# Patient Record
Sex: Male | Born: 1975 | Race: White | Hispanic: No | Marital: Married | Smoking: Former smoker
Health system: Southern US, Community
[De-identification: ages and names within clinical notes are randomized; demographics above are authoritative.]

## PROBLEM LIST (undated history)

## (undated) DIAGNOSIS — J111 Influenza due to unidentified influenza virus with other respiratory manifestations: Secondary | ICD-10-CM

## (undated) DIAGNOSIS — R519 Headache, unspecified: Secondary | ICD-10-CM

## (undated) DIAGNOSIS — R51 Headache: Secondary | ICD-10-CM

## (undated) HISTORY — PX: TYMPANOSTOMY TUBE PLACEMENT: SHX32

## (undated) HISTORY — PX: HERNIA REPAIR: SHX51

---

## 2003-01-10 HISTORY — PX: MANDIBLE FRACTURE SURGERY: SHX706

## 2005-12-03 ENCOUNTER — Emergency Department: Payer: Self-pay | Admitting: Emergency Medicine

## 2005-12-04 ENCOUNTER — Emergency Department: Payer: Self-pay

## 2005-12-05 ENCOUNTER — Emergency Department: Payer: Self-pay | Admitting: Emergency Medicine

## 2005-12-06 ENCOUNTER — Emergency Department: Payer: Self-pay | Admitting: Emergency Medicine

## 2007-04-07 ENCOUNTER — Emergency Department: Payer: Self-pay | Admitting: Emergency Medicine

## 2009-07-05 ENCOUNTER — Emergency Department: Payer: Self-pay | Admitting: Emergency Medicine

## 2010-09-16 ENCOUNTER — Emergency Department: Payer: Self-pay | Admitting: Emergency Medicine

## 2012-01-14 ENCOUNTER — Emergency Department: Payer: Self-pay | Admitting: Emergency Medicine

## 2012-01-17 ENCOUNTER — Emergency Department: Payer: Self-pay | Admitting: Emergency Medicine

## 2012-09-15 ENCOUNTER — Emergency Department: Payer: Self-pay | Admitting: Emergency Medicine

## 2012-09-15 LAB — COMPREHENSIVE METABOLIC PANEL
Alkaline Phosphatase: 160 U/L — ABNORMAL HIGH (ref 50–136)
Anion Gap: 6 — ABNORMAL LOW (ref 7–16)
Bilirubin,Total: 0.4 mg/dL (ref 0.2–1.0)
Calcium, Total: 8.8 mg/dL (ref 8.5–10.1)
Chloride: 106 mmol/L (ref 98–107)
Co2: 27 mmol/L (ref 21–32)
Creatinine: 0.88 mg/dL (ref 0.60–1.30)
EGFR (Non-African Amer.): >60
Glucose: 140 mg/dL — ABNORMAL HIGH (ref 65–99)
SGOT(AST): 30 U/L (ref 15–37)
Sodium: 139 mmol/L (ref 136–145)
Total Protein: 7.2 g/dL (ref 6.4–8.2)

## 2012-09-15 LAB — CBC
HCT: 44.9 % (ref 40.0–52.0)
HGB: 15.4 g/dL (ref 13.0–18.0)
MCH: 30.5 pg (ref 26.0–34.0)
MCV: 89 fL (ref 80–100)
RBC: 5.05 10*6/uL (ref 4.40–5.90)

## 2014-02-26 ENCOUNTER — Ambulatory Visit (INDEPENDENT_AMBULATORY_CARE_PROVIDER_SITE_OTHER): Payer: BLUE CROSS/BLUE SHIELD | Admitting: Nurse Practitioner

## 2014-02-26 ENCOUNTER — Encounter: Payer: Self-pay | Admitting: Nurse Practitioner

## 2014-02-26 ENCOUNTER — Other Ambulatory Visit: Payer: Self-pay | Admitting: Nurse Practitioner

## 2014-02-26 VITALS — BP 138/78 | HR 96 | Temp 98.1°F | Resp 14 | Ht 75.0 in | Wt 271.0 lb

## 2014-02-26 DIAGNOSIS — Z720 Tobacco use: Secondary | ICD-10-CM

## 2014-02-26 DIAGNOSIS — Z0000 Encounter for general adult medical examination without abnormal findings: Secondary | ICD-10-CM

## 2014-02-26 DIAGNOSIS — F17200 Nicotine dependence, unspecified, uncomplicated: Secondary | ICD-10-CM

## 2014-02-26 DIAGNOSIS — Z7689 Persons encountering health services in other specified circumstances: Secondary | ICD-10-CM

## 2014-02-26 DIAGNOSIS — Z7189 Other specified counseling: Secondary | ICD-10-CM

## 2014-02-26 DIAGNOSIS — M545 Low back pain, unspecified: Secondary | ICD-10-CM

## 2014-02-26 DIAGNOSIS — L02211 Cutaneous abscess of abdominal wall: Secondary | ICD-10-CM

## 2014-02-26 LAB — LIPID PANEL
CHOL/HDL RATIO: 4
Cholesterol: 139 mg/dL (ref 0–200)
HDL: 32.4 mg/dL — AB (ref >39.00)
LDL Cholesterol: 77 mg/dL (ref 0–99)
NonHDL: 106.6
TRIGLYCERIDES: 147 mg/dL (ref 0.0–149.0)
VLDL: 29.4 mg/dL (ref 0.0–40.0)

## 2014-02-26 LAB — COMPREHENSIVE METABOLIC PANEL
ALT: 34 U/L (ref 0–53)
AST: 21 U/L (ref 0–37)
Albumin: 4.7 g/dL (ref 3.5–5.2)
Alkaline Phosphatase: 126 U/L — ABNORMAL HIGH (ref 39–117)
BILIRUBIN TOTAL: 0.6 mg/dL (ref 0.2–1.2)
BUN: 23 mg/dL (ref 6–23)
CO2: 29 mEq/L (ref 19–32)
Calcium: 10.3 mg/dL (ref 8.4–10.5)
Chloride: 104 mEq/L (ref 96–112)
Creatinine, Ser: 0.94 mg/dL (ref 0.40–1.50)
GFR: 95.91 mL/min (ref >60.00)
Glucose, Bld: 90 mg/dL (ref 70–99)
POTASSIUM: 4.7 meq/L (ref 3.5–5.1)
Sodium: 137 mEq/L (ref 135–145)
Total Protein: 8 g/dL (ref 6.0–8.3)

## 2014-02-26 LAB — CBC WITH DIFFERENTIAL/PLATELET
BASOS PCT: 0.4 % (ref 0.0–3.0)
Basophils Absolute: 0 10*3/uL (ref 0.0–0.1)
EOS ABS: 0.2 10*3/uL (ref 0.0–0.7)
Eosinophils Relative: 1.8 % (ref 0.0–5.0)
HCT: 48.2 % (ref 39.0–52.0)
HEMOGLOBIN: 16.5 g/dL (ref 13.0–17.0)
LYMPHS PCT: 28.8 % (ref 12.0–46.0)
Lymphs Abs: 3.1 10*3/uL (ref 0.7–4.0)
MCHC: 34.3 g/dL (ref 30.0–36.0)
MCV: 85.6 fl (ref 78.0–100.0)
Monocytes Absolute: 0.6 10*3/uL (ref 0.1–1.0)
Monocytes Relative: 5.8 % (ref 3.0–12.0)
NEUTROS ABS: 6.8 10*3/uL (ref 1.4–7.7)
Neutrophils Relative %: 63.2 % (ref 43.0–77.0)
Platelets: 222 10*3/uL (ref 150.0–400.0)
RBC: 5.64 Mil/uL (ref 4.22–5.81)
RDW: 13.9 % (ref 11.5–15.5)
WBC: 10.8 10*3/uL — ABNORMAL HIGH (ref 4.0–10.5)

## 2014-02-26 LAB — HEMOGLOBIN A1C: Hgb A1c MFr Bld: 5.9 % (ref 4.6–6.5)

## 2014-02-26 LAB — TSH: TSH: 1.59 u[IU]/mL (ref 0.35–4.50)

## 2014-02-26 MED ORDER — NICOTINE 14 MG/24HR TD PT24: 14.0000 mg | MEDICATED_PATCH | Freq: Every day | TRANSDERMAL | Status: DC

## 2014-02-26 MED ORDER — ALBUTEROL SULFATE HFA 108 (90 BASE) MCG/ACT IN AERS: 2.0000 | INHALATION_SPRAY | Freq: Four times a day (QID) | RESPIRATORY_TRACT | Status: DC | PRN

## 2014-02-26 MED ORDER — MUPIROCIN 2 % EX OINT: 1.0000 "application " | TOPICAL_OINTMENT | Freq: Two times a day (BID) | CUTANEOUS | Status: DC

## 2014-02-26 MED ORDER — NICOTINE 7 MG/24HR TD PT24: 7.0000 mg | MEDICATED_PATCH | Freq: Every day | TRANSDERMAL | Status: DC

## 2014-02-26 NOTE — Progress Notes (Signed)
Subjective:    Patient ID: Albert Houston, male    DOB: 1975-05-15, 39 y.o.   MRN: [id number]  HPI  Albert Houston is a 39 yo male establishing care and with CCs of wheezing, weight loss, and tobacco abuse.   1) New Pt info-   Diet- Works all the time, eats a poor diet with lots of fast food  Exercise- Constantly walking at work  Immunizations- Needs Tdap, Flu had through work  [employer] Exam- Up to date  Dental Exam- Getting implants done, very poor dentition  2) Chronic Problems-  No chronic issues  3) Acute Problems-  Back pain- when lies down his legs go numb and hurt, had an accident a few months ago, fell down, He states this does not happen all the time. Discussed possible treatment and wants to wait.   Wants to quit smoking- 3 ppd and vaporizer   Bruising along lower abdomen Used to drink a lot of alcohol cases. Now drinking 4 cans a month     Review of Systems  Constitutional: Negative for fever, chills, diaphoresis, activity change, appetite change and fatigue.  Eyes: Negative for visual disturbance.  Respiratory: Positive for wheezing. Negative for cough, chest tightness and shortness of breath.   Cardiovascular: Negative for chest pain, palpitations and leg swelling.  Gastrointestinal: Negative for nausea, vomiting, diarrhea and blood in stool.  Genitourinary: Negative for dysuria and difficulty urinating.  Musculoskeletal: Positive for back pain and arthralgias. Negative for myalgias, joint swelling, gait problem, neck pain and neck stiffness.  Skin: Negative for rash.  Neurological: Positive for numbness. Negative for dizziness and weakness.  Hematological: Does not bruise/bleed easily.  Psychiatric/Behavioral: Positive for sleep disturbance. Negative for suicidal ideas. The patient is not nervous/anxious.    History reviewed. No pertinent past medical history.  History   Social History  . Marital Status: Single    Spouse Name: N/A  . Number of Children: N/A  .  Years of Education: N/A   Occupational History  . Not on file.   Social History Main Topics  . Smoking status: Current Every Day Smoker -- 3.00 packs/day  . Smokeless tobacco: Never Used     Comment: Wants to try patches  . Alcohol Use: 0.6 oz/week    1 Cans of beer per week  . Drug Use: No  . Sexual Activity: Not on file   Other Topics Concern  . Not on file   Social History Narrative   Lives at home with wife   424 (Son) year old and 217 (daughter) year old    Works at [employer] in [place]    Enjoys Photographerdrag racing     Past Surgical History  Procedure Laterality Date  . Mandible fracture surgery  2005  . Tympanostomy tube placement      As a Child    Family History  Problem Relation Age of Onset  . Arthritis Maternal Grandmother   . Hypertension Maternal Grandmother   . Diabetes Paternal Grandfather     No Known Allergies  No current outpatient prescriptions on file prior to visit.   No current facility-administered medications on file prior to visit.      Objective:   Physical Exam  Constitutional: He is oriented to person, place, and time. He appears well-developed and well-nourished. No distress.  BP 138/78 mmHg  Pulse 96  Temp(Src) 98.1 F (36.7 C) (Oral)  Resp 14  Ht 6\' 3"  (1.905 m)  Wt 271 lb (122.925 kg)  BMI 33.87 kg/m2  SpO2 96%     HENT:  Head: Normocephalic and atraumatic.  Right Ear: External ear normal.  Left Ear: External ear normal.  Mouth/Throat: No oropharyngeal exudate.  Eyes: Conjunctivae and EOM are normal. Pupils are equal, round, and reactive to light. Right eye exhibits no discharge. Left eye exhibits no discharge. No scleral icterus.  Eyes are reddened  Neck: Normal range of motion. Neck supple. No tracheal deviation present. No thyromegaly present.  Cardiovascular: Normal rate, regular rhythm, normal heart sounds and intact distal pulses.  Exam reveals no gallop and no friction rub.   No murmur heard. Pulmonary/Chest:  Effort normal and breath sounds normal. No respiratory distress. He has no wheezes. He has no rales. He exhibits no tenderness.  Abdominal: Soft. Bowel sounds are normal. He exhibits no distension and no mass. There is no tenderness. There is no rebound and no guarding.  Lymphadenopathy:    He has no cervical adenopathy.  Neurological: He is alert and oriented to person, place, and time.  Uvula does not rise midline, shifts to the right  Skin: Skin is warm. No rash noted. He is not diaphoretic. There is erythema.     Redness to skin all over Poor hygeine  Psychiatric: He has a normal mood and affect. His behavior is normal. Judgment and thought content normal.       Assessment & Plan:

## 2014-02-26 NOTE — Patient Instructions (Signed)
Nicotine skin patches What is this medicine? NICOTINE (NIK oh teen) helps people stop smoking. The patches replace the nicotine found in cigarettes and help to decrease withdrawal effects. They are most effective when used in combination with a stop-smoking program. This medicine may be used for other purposes; ask your health care provider or pharmacist if you have questions. COMMON BRAND NAME(S): Habitrol, Nicoderm CQ, Nicotrol What should I tell my health care provider before I take this medicine? They need to know if you have any of these conditions: -diabetes -heart disease, angina, irregular heartbeat or previous heart attack -lung disease, including asthma -overactive thyroid -pheochromocytoma -skin problems -stomach problems or ulcers -an unusual or allergic reaction to nicotine, adhesives, other medicines, foods, dyes, or preservatives -pregnant or trying to get pregnant -breast-feeding How should I use this medicine? This medicine is for use on the skin. Follow the directions that come with the patches. Find an area of skin on your upper arm, chest, or back that is clean, dry, greaseless, undamaged and hairless. Wash hands with plain soap and water. Do not use anything that contains aloe, lanolin or glycerin as these may prevent the patch from sticking. Dry thoroughly. Remove the patch from the sealed pouch. Do not try to cut or trim the patch. Using your palm, press the patch firmly in place for 10 seconds to make sure that there is good contact with your skin. After applying the patch, wash your hands. Change the patch every day, keeping to a regular schedule. When you apply a new patch, use a new area of skin. Wait at least 1 week before using the same area again. Talk to your pediatrician regarding the use of this medicine in children. Special care may be needed. Overdosage: If you think you have taken too much of this medicine contact a poison control center or emergency room at  once. NOTE: This medicine is only for you. Do not share this medicine with others. What if I miss a dose? If you forget to replace a patch, use it as soon as you can. Only use one patch at a time and do not leave on the skin for longer than directed. If a patch falls off, you can replace it, but keep to your schedule and remove the patch at the right time. What may interact with this medicine? -medicines for asthma -medicines for blood pressure -medicines for mental depression This list may not describe all possible interactions. Give your health care provider a list of all the medicines, herbs, non-prescription drugs, or dietary supplements you use. Also tell them if you smoke, drink alcohol, or use illegal drugs. Some items may interact with your medicine. What should I watch for while using this medicine? You should begin using the nicotine patch the day you stop smoking. It is okay if you do not succeed at your attempt to quit and have a cigarette. You can still continue your quit attempt and keep using the product as directed. Just throw away your cigarettes and get back to your quit plan. You can keep the patch in place during swimming, bathing, and showering. If your patch falls off during these activities, replace it. When you first apply the patch, your skin may itch or burn. This should soon go away. When you remove a patch, the skin may look red, but this should only last for a day. Call your doctor or health care professional if you get a permanent skin rash. If you are a diabetic   and you quit smoking, the effects of insulin may be increased and you may need to reduce your insulin dose. Check with your doctor or health care professional about how you should adjust your insulin dose. If you are going to have a magnetic resonance imaging (MRI) procedure, tell your MRI technician if you have this patch on your body. It must be removed before a MRI. What side effects may I notice from  receiving this medicine? Side effects that you should report to your doctor or health care professional as soon as possible: -allergic reactions like skin rash, itching or hives, swelling of the face, lips, or tongue -breathing problems -changes in hearing -changes in vision -chest pain -cold sweats -confusion -fast, irregular heartbeat -feeling faint or lightheaded, falls -headache -increased saliva -nausea, vomiting -skin redness that lasts more than 4 days -stomach pain -weakness Side effects that usually do not require medical attention (report to your doctor or health care professional if they continue or are bothersome): -diarrhea -dry mouth -hiccups -irritability -nervousness or restlessness -trouble sleeping or vivid dreams This list may not describe all possible side effects. Call your doctor for medical advice about side effects. You may report side effects to FDA at 1-800-FDA-1088. Where should I keep my medicine? Keep out of the reach of children. Store at room temperature between 20 and 25 degrees C (68 and 77 degrees F). Protect from heat and light. Store in manufacturers packaging until ready to use. Throw away unused medicine after the expiration date. When you remove a patch, fold with sticky sides together; put in an empty opened pouch and throw away. NOTE: This sheet is a summary. It may not cover all possible information. If you have questions about this medicine, talk to your doctor, pharmacist, or health care provider.  2015, Elsevier/Gold Standard. (2013-01-15 12:51:27)  

## 2014-02-26 NOTE — Progress Notes (Signed)
Pre visit review using our clinic review tool, if applicable. No additional management support is needed unless otherwise documented below in the visit note. 

## 2014-02-27 ENCOUNTER — Telehealth: Payer: Self-pay | Admitting: *Deleted

## 2014-02-27 ENCOUNTER — Other Ambulatory Visit: Payer: Self-pay | Admitting: Nurse Practitioner

## 2014-02-27 ENCOUNTER — Telehealth: Payer: Self-pay | Admitting: Nurse Practitioner

## 2014-02-27 MED ORDER — NICOTINE 21 MG/24HR TD PT24: 21.0000 mg | MEDICATED_PATCH | Freq: Every day | TRANSDERMAL | Status: DC

## 2014-02-27 NOTE — Telephone Encounter (Signed)
Left message on pts VM that correct Rx has been sent to pharmacy

## 2014-02-27 NOTE — Telephone Encounter (Signed)
emmi mailed  °

## 2014-02-27 NOTE — Telephone Encounter (Signed)
Sorry, there was no designation when I sent it in. I found the correct mg. Sent it today at 1:08.

## 2014-02-27 NOTE — Telephone Encounter (Signed)
Pt called states the Nicotine patch Rx that was sent to pharmacy is the Step 3.  Pt needs the Step 1.  Please advise

## 2014-03-01 DIAGNOSIS — M545 Low back pain, unspecified: Secondary | ICD-10-CM | POA: Insufficient documentation

## 2014-03-01 DIAGNOSIS — F17200 Nicotine dependence, unspecified, uncomplicated: Secondary | ICD-10-CM | POA: Insufficient documentation

## 2014-03-01 DIAGNOSIS — L02211 Cutaneous abscess of abdominal wall: Secondary | ICD-10-CM | POA: Insufficient documentation

## 2014-03-01 DIAGNOSIS — Z7689 Persons encountering health services in other specified circumstances: Secondary | ICD-10-CM | POA: Insufficient documentation

## 2014-03-01 NOTE — Assessment & Plan Note (Addendum)
Stable. Wants to wait for x-rays or further treatment- neurologically and msk intact today. FU in 2 months

## 2014-03-01 NOTE — Assessment & Plan Note (Signed)
Bactroban ointment rx. Improved on its own. Asked pt to keep protected from rubbing by pants line.

## 2014-03-01 NOTE — Assessment & Plan Note (Signed)
Wants to quit smoking. Nicotine patch sent to pharmacy and albuterol inhaler refilled for wheezing. Handout given about patch.

## 2014-03-01 NOTE — Assessment & Plan Note (Signed)
Discussed acute and chronic issues. Reviewed health maintenance measures, PFSHx, and immunizations. Obtain routine labs TSH, Lipid panel, CBC w/ diff, A1c, and CMET.   

## 2014-04-27 ENCOUNTER — Ambulatory Visit: Payer: BLUE CROSS/BLUE SHIELD | Admitting: Nurse Practitioner

## 2014-05-19 ENCOUNTER — Encounter: Payer: Self-pay | Admitting: Emergency Medicine

## 2014-05-19 ENCOUNTER — Emergency Department
Admission: EM | Admit: 2014-05-19 | Discharge: 2014-05-19 | Disposition: A | Discharge status: Home / Self Care | Payer: BLUE CROSS/BLUE SHIELD | Attending: Emergency Medicine | Admitting: Emergency Medicine

## 2014-05-19 ENCOUNTER — Emergency Department: Payer: BLUE CROSS/BLUE SHIELD

## 2014-05-19 DIAGNOSIS — Y9289 Other specified places as the place of occurrence of the external cause: Secondary | ICD-10-CM | POA: Diagnosis not present

## 2014-05-19 DIAGNOSIS — W01198A Fall on same level from slipping, tripping and stumbling with subsequent striking against other object, initial encounter: Secondary | ICD-10-CM | POA: Insufficient documentation

## 2014-05-19 DIAGNOSIS — Y9389 Activity, other specified: Secondary | ICD-10-CM | POA: Insufficient documentation

## 2014-05-19 DIAGNOSIS — Z720 Tobacco use: Secondary | ICD-10-CM | POA: Insufficient documentation

## 2014-05-19 DIAGNOSIS — S99922A Unspecified injury of left foot, initial encounter: Secondary | ICD-10-CM | POA: Diagnosis present

## 2014-05-19 DIAGNOSIS — S91332A Puncture wound without foreign body, left foot, initial encounter: Secondary | ICD-10-CM | POA: Diagnosis not present

## 2014-05-19 DIAGNOSIS — Y998 Other external cause status: Secondary | ICD-10-CM | POA: Diagnosis not present

## 2014-05-19 MED ORDER — OXYCODONE-ACETAMINOPHEN 5-325 MG PO TABS
ORAL_TABLET | ORAL | Status: AC
  Administered 2014-05-19: 1 via ORAL
  Filled 2014-05-19: qty 1

## 2014-05-19 MED ORDER — OXYCODONE-ACETAMINOPHEN 5-325 MG PO TABS: 1.0000 | ORAL_TABLET | ORAL | Status: DC | PRN

## 2014-05-19 MED ORDER — CEPHALEXIN 500 MG PO CAPS
ORAL_CAPSULE | ORAL | Status: AC
  Administered 2014-05-19: 500 mg via ORAL
  Filled 2014-05-19: qty 1

## 2014-05-19 MED ORDER — CEPHALEXIN 500 MG PO CAPS: 500.0000 mg | ORAL_CAPSULE | Freq: Two times a day (BID) | ORAL | Status: DC

## 2014-05-19 MED ORDER — OXYCODONE-ACETAMINOPHEN 5-325 MG PO TABS
1.0000 | ORAL_TABLET | Freq: Once | ORAL | Status: AC
  Administered 2014-05-19: 1 via ORAL

## 2014-05-19 MED ORDER — CEPHALEXIN 500 MG PO CAPS
500.0000 mg | ORAL_CAPSULE | Freq: Once | ORAL | Status: AC
  Administered 2014-05-19: 500 mg via ORAL
  Filled 2014-05-19: qty 1

## 2014-05-19 NOTE — Discharge Instructions (Signed)
Puncture Wound °A puncture wound is an injury that extends through all layers of the skin and into the tissue beneath the skin (subcutaneous tissue). Puncture wounds become infected easily because germs often enter the body and go beneath the skin during the injury. Having a deep wound with a small entrance point makes it difficult for your caregiver to adequately clean the wound. This is especially true if you have stepped on a nail and it has passed through a dirty shoe or other situations where the wound is obviously contaminated. °CAUSES  °Many puncture wounds involve glass, nails, splinters, fish hooks, or other objects that enter the skin (foreign bodies). A puncture wound may also be caused by a human bite or animal bite. °DIAGNOSIS  °A puncture wound is usually diagnosed by your history and a physical exam. You may need to have an X-ray or an ultrasound to check for any foreign bodies still in the wound. °TREATMENT  °· Your caregiver will clean the wound as thoroughly as possible. Depending on the location of the wound, a bandage (dressing) may be applied. °· Your caregiver might prescribe antibiotic medicines. °· You may need a follow-up visit to check on your wound. Follow all instructions as directed by your caregiver. °HOME CARE INSTRUCTIONS  °· Change your dressing once per day, or as directed by your caregiver. If the dressing sticks, it may be removed by soaking the area in water. °· If your caregiver has given you follow-up instructions, it is very important that you return for a follow-up appointment. Not following up as directed could result in a chronic or permanent injury, pain, and disability. °· Only take over-the-counter or prescription medicines for pain, discomfort, or fever as directed by your caregiver. °· If you are given antibiotics, take them as directed. Finish them even if you start to feel better. °You may need a tetanus shot if: °· You cannot remember when you had your last tetanus  shot. °· You have never had a tetanus shot. °If you got a tetanus shot, your arm may swell, get red, and feel warm to the touch. This is common and not a problem. If you need a tetanus shot and you choose not to have one, there is a rare chance of getting tetanus. Sickness from tetanus can be serious. °You may need a rabies shot if an animal bite caused your puncture wound. °SEEK MEDICAL CARE IF:  °· You have redness, swelling, or increasing pain in the wound. °· You have red streaks going away from the wound. °· You notice a bad smell coming from the wound or dressing. °· You have yellowish-white fluid (pus) coming from the wound. °· You are treated with an antibiotic for infection, but the infection is not getting better. °· You notice something in the wound, such as rubber from your shoe, cloth, or another object. °· You have a fever. °· You have severe pain. °· You have difficulty breathing. °· You feel dizzy or faint. °· You cannot stop vomiting. °· You lose feeling, develop numbness, or cannot move a limb below the wound. °· Your symptoms worsen. °MAKE SURE YOU: °· Understand these instructions. °· Will watch your condition. °· Will get help right away if you are not doing well or get worse. °Document Released: 10/05/2004 Document Revised: 03/20/2011 Document Reviewed: 06/14/2010 °ExitCare® Patient Information ©2015 [employer]. This information is not intended to replace advice given to you by your health care provider. Make sure you discuss any questions you   have with your health care provider. ° °

## 2014-05-19 NOTE — ED Notes (Signed)
Pt arrived to the ED accompanied by his wife complaining of left foot pain after a "[place]" tool felt on his foot and puncture it around the ankle area (1/2 inch deep). Sensation, pulses and range of motion are slightly affected. Pt is AOx4 in no apparent distress.

## 2014-05-19 NOTE — ED Provider Notes (Signed)
[facility] Emergency Department Provider Note  ____________________________________________  Time seen: 4:35 AM  I have reviewed the triage vital signs and the nursing notes.   HISTORY  Chief Complaint Ankle Pain     HPI Albert Houston is a 39 y.o. male presents with history of tramadol Job wrist falling and puncturing left ankle patient admits to pulling the drillbit out of his leg. Patient now complains of 10 out of 10 pain and swelling to the left ankle. No fever incident happened at 2 PM yesterday     History reviewed. No pertinent past medical history.  Patient Active Problem List   Diagnosis Date Noted  . Encounter to establish care 03/01/2014  . Tobacco use disorder 03/01/2014  . Abscess of skin of abdomen 03/01/2014  . Low back pain 03/01/2014    Past Surgical History  Procedure Laterality Date  . Mandible fracture surgery  2005  . Tympanostomy tube placement      As a Child    Current Outpatient Rx  Name  Route  Sig  Dispense  Refill  . albuterol (PROVENTIL HFA;VENTOLIN HFA) 108 (90 BASE) MCG/ACT inhaler   Inhalation   Inhale 2 puffs into the lungs every 6 (six) hours as needed for wheezing or shortness of breath.   1 Inhaler   0   . mupirocin ointment (BACTROBAN) 2 %   Nasal   Place 1 application into the nose 2 (two) times daily.   22 g   0   . nicotine (NICODERM CQ - DOSED IN MG/24 HOURS) 21 mg/24hr patch   Transdermal   Place 1 patch (21 mg total) onto the skin daily.   28 patch   0     Allergies Review of patient's allergies indicates no known allergies.  Family History  Problem Relation Age of Onset  . Arthritis Maternal Grandmother   . Hypertension Maternal Grandmother   . Diabetes Paternal Grandfather     Social History History  Substance Use Topics  . Smoking status: Current Every Day Smoker -- 3.00 packs/day  . Smokeless tobacco: Never Used     Comment: Wants to try patches  . Alcohol Use: 0.6  oz/week    1 Cans of beer per week    Review of Systems  Constitutional: Negative for fever. Eyes: Negative for visual changes. ENT: Negative for sore throat. Cardiovascular: Negative for chest pain. Respiratory: Negative for shortness of breath. Gastrointestinal: Negative for abdominal pain, vomiting and diarrhea. Genitourinary: Negative for dysuria. Musculoskeletal: Negative for back pain. Skin: Negative for rash. Neurological: Negative for headaches, focal weakness or numbness.  10-point ROS otherwise negative.  ____________________________________________   PHYSICAL EXAM:  VITAL SIGNS: ED Triage Vitals  Enc Vitals Group     BP 05/19/14 0134 129/81 mmHg     Pulse Rate 05/19/14 0134 95     Resp 05/19/14 0134 20     Temp 05/19/14 0134 98.5 F (36.9 C)     Temp Source 05/19/14 0134 Oral     SpO2 05/19/14 0134 97 %     Weight 05/19/14 0134 270 lb (122.471 kg)     Height 05/19/14 0134 6\' 3"  (1.905 m)     Head Cir --      Peak Flow --      Pain Score 05/19/14 0136 5     Pain Loc --      Pain Edu? --      Excl. in GC? --      Constitutional: Alert  and oriented. Well appearing and in no distress. Eyes: Conjunctivae are normal. PERRL. Normal extraocular movements. ENT   Head: Normocephalic and atraumatic.   Nose: No congestion/rhinnorhea.   Mouth/Throat: Mucous membranes are moist.   Neck: No stridor. Hematological/Lymphatic/Immunilogical: No cervical lymphadenopathy. Cardiovascular: Normal rate, regular rhythm. Normal and symmetric distal pulses are present in all extremities. No murmurs, rubs, or gallops. Respiratory: Normal respiratory effort without tachypnea nor retractions. Breath sounds are clear and equal bilaterally. No wheezes/rales/rhonchi. Gastrointestinal: Soft and nontender. No distention. There is no CVA tenderness. Genitourinary: deferred Musculoskeletal: Nontender with normal range of motion in all extremities. No joint effusions.  No  lower extremity tenderness nor edema. Neurologic:  Normal speech and language. No gross focal neurologic deficits are appreciated. Speech is normal.  Skin:  Skin is warm, dry and intact. No rash noted. Puncture wound noted to the anterior left lateral malleoli, no signs of infection at this time Psychiatric: Mood and affect are normal. Speech and behavior are normal. Patient exhibits appropriate insight and judgment.  ____________________________________________    _________________________    RADIOLOGY  negative left ankle x-ray  ____________________________________________     ____________________________________________   INITIAL IMPRESSION / ASSESSMENT AND PLAN / ED COURSE  Pertinent labs & imaging results that were available during my care of the patient were reviewed by me and considered in my medical decision making (see chart for details).  Given history and physical exam concern for puncture wound with risk of infection as such patient received Keflex 500 mg here in emergency department Percocet 5 mg tablets for pain we'll discharge patient home with outpatient follow-up  ____________________________________________   FINAL CLINICAL IMPRESSION(S) / ED DIAGNOSES  Final diagnoses:  Puncture wound of foot, left, initial encounter      Darci Currentandolph N Bethel Gaglio, MD 05/19/14 [id number]

## 2014-05-21 ENCOUNTER — Encounter: Payer: Self-pay | Admitting: Nurse Practitioner

## 2014-05-21 ENCOUNTER — Ambulatory Visit (INDEPENDENT_AMBULATORY_CARE_PROVIDER_SITE_OTHER): Payer: BLUE CROSS/BLUE SHIELD | Admitting: Nurse Practitioner

## 2014-05-21 VITALS — BP 138/88 | HR 113 | Temp 98.1°F | Resp 14 | Ht 75.0 in

## 2014-05-21 DIAGNOSIS — S99912D Unspecified injury of left ankle, subsequent encounter: Secondary | ICD-10-CM | POA: Diagnosis not present

## 2014-05-21 DIAGNOSIS — Z23 Encounter for immunization: Secondary | ICD-10-CM | POA: Diagnosis not present

## 2014-05-21 MED ORDER — NICOTINE 21 MG/24HR TD PT24: 21.0000 mg | MEDICATED_PATCH | Freq: Every day | TRANSDERMAL | Status: DC

## 2014-05-21 MED ORDER — OXYCODONE-ACETAMINOPHEN 10-325 MG PO TABS: 1.0000 | ORAL_TABLET | Freq: Two times a day (BID) | ORAL | Status: DC

## 2014-05-21 NOTE — Progress Notes (Signed)
Pre visit review using our clinic review tool, if applicable. No additional management support is needed unless otherwise documented below in the visit note. 

## 2014-05-21 NOTE — Patient Instructions (Addendum)
Follow up in 1 week.   Patches refilled with extra refills.

## 2014-05-21 NOTE — Progress Notes (Signed)
   Subjective:    Patient ID: Albert Houston, male    DOB: [birth year], 39 y.o.   MRN: [id number]  HPI  [name] is a 39 yo male with a CC of left foot foot pain x 4 days.    1) Pt reports the Percocets he is taking 2 of because of pain. He reports it only touches the pain when he has 2. Takes 2 in the morning and 2 at night. This is not a work accident and he reports he was in his shop when the drill bit fell and went into his foot. He was given antibiotics. He also reports he pulled it out himself.   Review of Systems  Constitutional: Negative for fever, chills, diaphoresis and fatigue.  Respiratory: Negative for chest tightness, shortness of breath and wheezing.   Cardiovascular: Positive for leg swelling. Negative for chest pain and palpitations.  Gastrointestinal: Negative for nausea, vomiting and diarrhea.  Musculoskeletal: Positive for myalgias and arthralgias.  Skin: Positive for wound.      Objective:   Physical Exam  Constitutional: He is oriented to person, place, and time. He appears well-developed and well-nourished. No distress.  BP 138/88 mmHg  Pulse 113  Temp(Src) 98.1 F (36.7 C) (Oral)  Resp 14  Ht 6\' 3"  (1.905 m)  Wt   SpO2 97%   HENT:  Head: Normocephalic and atraumatic.  Right Ear: External ear normal.  Left Ear: External ear normal.  Eyes: EOM are normal. Pupils are equal, round, and reactive to light. Right eye exhibits no discharge. Left eye exhibits no discharge. No scleral icterus.  Cardiovascular: Normal rate, regular rhythm and normal heart sounds.  Exam reveals no gallop and no friction rub.   No murmur heard. Pulmonary/Chest: Effort normal and breath sounds normal. No respiratory distress. He has no wheezes. He has no rales. He exhibits no tenderness.  Neurological: He is alert and oriented to person, place, and time.  Skin: Skin is warm and dry. He is not diaphoretic.  Psychiatric: He has a normal mood and affect. His behavior is normal. Judgment  and thought content normal.      Assessment & Plan:

## 2014-05-22 ENCOUNTER — Encounter: Payer: Self-pay | Admitting: Nurse Practitioner

## 2014-05-28 ENCOUNTER — Ambulatory Visit (INDEPENDENT_AMBULATORY_CARE_PROVIDER_SITE_OTHER): Payer: BLUE CROSS/BLUE SHIELD | Admitting: Nurse Practitioner

## 2014-05-28 VITALS — BP 130/86 | HR 105 | Temp 98.2°F | Resp 18 | Ht 75.0 in | Wt 267.8 lb

## 2014-05-28 DIAGNOSIS — S91032S Puncture wound without foreign body, left ankle, sequela: Secondary | ICD-10-CM

## 2014-05-28 DIAGNOSIS — L600 Ingrowing nail: Secondary | ICD-10-CM | POA: Diagnosis not present

## 2014-05-28 NOTE — Progress Notes (Signed)
Pre visit review using our clinic review tool, if applicable. No additional management support is needed unless otherwise documented below in the visit note. 

## 2014-05-28 NOTE — Patient Instructions (Signed)
We will contact you about your referral to podiatry.

## 2014-05-28 NOTE — Progress Notes (Signed)
   Subjective:    Patient ID: Albert Houston, male    DOB: Jun 09, 1975, 39 y.o.   MRN: [id number]  HPI  Mr. Andrey CampanileWilson is a 39 yo male with a follow up on his left food puncture wound.   1) Still having pain around the top of the foot and lateral ankle. Still swelling of medial and lateral ankle. Unable to stand on whole foot, only toes. If standing on whole foot he reports a shooting pain up both sides of his ankle.   Review of Systems  Constitutional: Negative for fever, chills, diaphoresis and fatigue.  Eyes: Negative for visual disturbance.  Respiratory: Negative for chest tightness, shortness of breath and wheezing.   Cardiovascular: Positive for leg swelling.  Musculoskeletal: Positive for arthralgias and gait problem.  Neurological: Negative for dizziness, weakness and numbness.  Psychiatric/Behavioral: The patient is not nervous/anxious.       Objective:   Physical Exam  Constitutional: He is oriented to person, place, and time. He appears well-developed and well-nourished. No distress.  BP 130/86 mmHg  Pulse 105  Temp(Src) 98.2 F (36.8 C)  Resp 18  Ht 6\' 3"  (1.905 m)  Wt 267 lb 12.8 oz (121.473 kg)  BMI 33.47 kg/m2  SpO2 98%   HENT:  Head: Normocephalic and atraumatic.  Right Ear: External ear normal.  Left Ear: External ear normal.  Cardiovascular: Normal rate, regular rhythm, normal heart sounds and intact distal pulses.  Exam reveals no gallop and no friction rub.   No murmur heard. Pulmonary/Chest: Effort normal and breath sounds normal. No respiratory distress. He has no wheezes. He has no rales. He exhibits no tenderness.  Musculoskeletal: He exhibits edema and tenderness.  Looks improved! Tender to palpation still, no signs of infection.   Neurological: He is alert and oriented to person, place, and time.  Skin: Skin is warm and dry. No rash noted. He is not diaphoretic.  Psychiatric: He has a normal mood and affect. His behavior is normal. Judgment and thought  content normal.      Assessment & Plan:

## 2014-05-29 ENCOUNTER — Encounter: Payer: Self-pay | Admitting: Nurse Practitioner

## 2014-05-29 NOTE — Telephone Encounter (Signed)
Pt called, left message requesting a work note until he is seen by Podiatry.  Please advise

## 2014-06-05 ENCOUNTER — Telehealth: Payer: Self-pay | Admitting: Nurse Practitioner

## 2014-06-05 NOTE — Telephone Encounter (Signed)
Pt dropped off forms to be filled out.  Forms in Doss's box//tes

## 2014-06-05 NOTE — Telephone Encounter (Signed)
Paperwork given to NP.

## 2014-06-07 DIAGNOSIS — S99912A Unspecified injury of left ankle, initial encounter: Secondary | ICD-10-CM | POA: Insufficient documentation

## 2014-06-07 NOTE — Assessment & Plan Note (Addendum)
Following up from ER visit for drill but puncture to left ankle. Continue antibiotics. 1 more percocet for 1 week prescription. Tdap was given

## 2014-06-13 ENCOUNTER — Encounter: Payer: Self-pay | Admitting: Nurse Practitioner

## 2014-06-13 DIAGNOSIS — L600 Ingrowing nail: Secondary | ICD-10-CM | POA: Insufficient documentation

## 2014-06-13 DIAGNOSIS — S91039A Puncture wound without foreign body, unspecified ankle, initial encounter: Secondary | ICD-10-CM | POA: Insufficient documentation

## 2014-06-13 NOTE — Assessment & Plan Note (Signed)
Discussed options with sending him to Ortho or to podiatry. Pt reports toenail problems and would like to see podiatry. Referral placed. Finished abx.

## 2014-06-13 NOTE — Assessment & Plan Note (Signed)
Great toes have ingrown toenails. Pt reports it as a chronic issue. He has not sought care in past. Will refer to podiatry.

## 2014-06-15 ENCOUNTER — Ambulatory Visit (INDEPENDENT_AMBULATORY_CARE_PROVIDER_SITE_OTHER): Payer: BLUE CROSS/BLUE SHIELD

## 2014-06-15 ENCOUNTER — Encounter: Payer: Self-pay | Admitting: Podiatry

## 2014-06-15 ENCOUNTER — Other Ambulatory Visit: Payer: Self-pay | Admitting: Nurse Practitioner

## 2014-06-15 ENCOUNTER — Ambulatory Visit (INDEPENDENT_AMBULATORY_CARE_PROVIDER_SITE_OTHER): Payer: BLUE CROSS/BLUE SHIELD | Admitting: Podiatry

## 2014-06-15 VITALS — BP 142/90 | HR 92 | Resp 16 | Ht 75.0 in | Wt 267.0 lb

## 2014-06-15 DIAGNOSIS — M722 Plantar fascial fibromatosis: Secondary | ICD-10-CM

## 2014-06-15 DIAGNOSIS — L603 Nail dystrophy: Secondary | ICD-10-CM | POA: Diagnosis not present

## 2014-06-15 DIAGNOSIS — M76822 Posterior tibial tendinitis, left leg: Secondary | ICD-10-CM | POA: Diagnosis not present

## 2014-06-15 DIAGNOSIS — S9002XA Contusion of left ankle, initial encounter: Secondary | ICD-10-CM | POA: Diagnosis not present

## 2014-06-15 MED ORDER — MELOXICAM 15 MG PO TABS: 15.0000 mg | ORAL_TABLET | Freq: Every day | ORAL | Status: DC

## 2014-06-15 MED ORDER — METHYLPREDNISOLONE 4 MG PO TBPK: ORAL_TABLET | ORAL | Status: DC

## 2014-06-15 NOTE — Progress Notes (Signed)
   Subjective:    Patient ID: Albert Houston, male    DOB: 1975-02-06, 39 y.o.   MRN: [id number]  HPI  About four weeks ago dropped a tool on my foot and ankle, it has been swelling and painful.  It throbs at night and i can hardly put pressure on my heel during the day. Also my toenails are growing funny and they are thick.    Review of Systems  All other systems reviewed and are negative.      Objective:   Physical Exam : I have reviewed  His past medical history medications allergies surgery social history and review of systems. Pulses are palpable bilateral. Neurologic sensorium is intact. All extrinsic and intrinsic musculature is intact. Orthopedic evaluation demonstrates pain on palpation medial calcaneal tubercle of the left heel. Superficial wound dorsolateral aspect of the left ankle has gone on to heal uneventfully from his work injury. Radiographic evaluation demonstrates  Plantar distally oriented calcaneal heel spur soft tissue increasing density at the plantar fascial calcaneal insertion site indicative of plantar fasciitis.        Assessment & Plan:   assessment: plantar fasciitis left heel.  Plan: started him on a Medrol Dosepak to be followed by myopic. injected the left heel. Plantar fascial strapping and a night splint was applied. Started him on ice therapy dispensed. We discussed appropriate shoe gear stretching exercises ice therapy and shoe modifications. I'll follow-up with him in 1 month

## 2014-06-16 ENCOUNTER — Telehealth: Payer: Self-pay

## 2014-06-16 NOTE — Telephone Encounter (Signed)
Called and left message letting the patient know his disability paperwork was ready to be picked up.

## 2014-06-18 ENCOUNTER — Telehealth: Payer: Self-pay | Admitting: Nurse Practitioner

## 2014-06-18 NOTE — Telephone Encounter (Signed)
Pt dropped off form for insurance to be filled out. Form in Doss-NP box/msn

## 2014-06-18 NOTE — Telephone Encounter (Signed)
Noted. Thanks.

## 2014-06-26 DIAGNOSIS — Z7689 Persons encountering health services in other specified circumstances: Secondary | ICD-10-CM

## 2014-07-08 ENCOUNTER — Encounter: Payer: Self-pay | Admitting: Podiatry

## 2014-07-08 ENCOUNTER — Ambulatory Visit (INDEPENDENT_AMBULATORY_CARE_PROVIDER_SITE_OTHER): Payer: BLUE CROSS/BLUE SHIELD | Admitting: Podiatry

## 2014-07-08 VITALS — BP 147/75 | HR 90 | Resp 18

## 2014-07-08 DIAGNOSIS — Z79899 Other long term (current) drug therapy: Secondary | ICD-10-CM | POA: Diagnosis not present

## 2014-07-08 DIAGNOSIS — M79606 Pain in leg, unspecified: Secondary | ICD-10-CM | POA: Diagnosis not present

## 2014-07-08 DIAGNOSIS — M722 Plantar fascial fibromatosis: Secondary | ICD-10-CM

## 2014-07-08 DIAGNOSIS — L603 Nail dystrophy: Secondary | ICD-10-CM

## 2014-07-08 MED ORDER — ITRACONAZOLE 100 MG PO CAPS: 100.0000 mg | ORAL_CAPSULE | Freq: Two times a day (BID) | ORAL | Status: DC

## 2014-07-08 NOTE — Progress Notes (Signed)
Patient presents today for follow-up of fungal culture for the toenails and plantar fasciitis left.  Objective: Onychomycosis pathology report and still some pain on palpation of left heel.   Assessment: Onychomycosis.Plantar fasciitis left.  Plan: Treatment for onchycomycosis with itraconazole 100 mg tablets 1 by mouth twice daily 30 days. We have also requested blood work consisting of a liver profile and a CBC. We discussed the possible complications of this medication as well as its benefits and side effects. Patient will notify us should any of these arise. The left heel was injected with kenalog and local anesthetic and he will continue to use the night splint and ice daily. I will follow-up with patient in 1 month.

## 2014-07-09 ENCOUNTER — Telehealth: Payer: Self-pay | Admitting: *Deleted

## 2014-07-09 LAB — CBC WITH DIFFERENTIAL/PLATELET
Basophils Absolute: 0 10*3/uL (ref 0.0–0.2)
Basos: 0 %
EOS (ABSOLUTE): 0.2 10*3/uL (ref 0.0–0.4)
EOS: 2 %
HEMATOCRIT: 46.1 % (ref 37.5–51.0)
HEMOGLOBIN: 15.5 g/dL (ref 12.6–17.7)
Immature Grans (Abs): 0 10*3/uL (ref 0.0–0.1)
Immature Granulocytes: 0 %
Lymphocytes Absolute: 2.5 10*3/uL (ref 0.7–3.1)
Lymphs: 30 %
MCH: 29.7 pg (ref 26.6–33.0)
MCHC: 33.6 g/dL (ref 31.5–35.7)
MCV: 88 fL (ref 79–97)
Monocytes Absolute: 0.7 10*3/uL (ref 0.1–0.9)
Monocytes: 8 %
NEUTROS ABS: 5.1 10*3/uL (ref 1.4–7.0)
Neutrophils: 60 %
PLATELETS: 199 10*3/uL (ref 150–379)
RBC: 5.22 x10E6/uL (ref 4.14–5.80)
RDW: 15.1 % (ref 12.3–15.4)
WBC: 8.5 10*3/uL (ref 3.4–10.8)

## 2014-07-09 LAB — HEPATIC FUNCTION PANEL
ALBUMIN: 4.5 g/dL (ref 3.5–5.5)
ALK PHOS: 136 IU/L — AB (ref 39–117)
ALT: 53 IU/L — ABNORMAL HIGH (ref 0–44)
AST: 28 IU/L (ref 0–40)
BILIRUBIN TOTAL: 0.6 mg/dL (ref 0.0–1.2)
BILIRUBIN, DIRECT: 0.15 mg/dL (ref 0.00–0.40)
TOTAL PROTEIN: 7.1 g/dL (ref 6.0–8.5)

## 2014-07-09 NOTE — Telephone Encounter (Signed)
-----   Message from Elinor ParkinsonMax T Hyatt, [place] sent at 07/09/2014  6:48 AM EDT ----- Blood work looks stable and will retest in one month

## 2014-07-09 NOTE — Telephone Encounter (Signed)
Left message regarding blood work

## 2014-07-17 ENCOUNTER — Encounter: Payer: Self-pay | Admitting: Podiatry

## 2014-07-20 ENCOUNTER — Telehealth: Payer: Self-pay | Admitting: *Deleted

## 2014-07-20 NOTE — Telephone Encounter (Signed)
Left message for patient to return call on the prescription he is wanting refilled

## 2014-07-21 ENCOUNTER — Telehealth: Payer: Self-pay | Admitting: *Deleted

## 2014-07-21 NOTE — Telephone Encounter (Signed)
Prior Berkley Harveyauth is needed for sporonox , pre cert begun

## 2014-07-28 ENCOUNTER — Other Ambulatory Visit: Payer: Self-pay | Admitting: *Deleted

## 2014-07-28 MED ORDER — ITRACONAZOLE 200 MG PO TABS: 200.0000 mg | ORAL_TABLET | Freq: Every day | ORAL | Status: DC

## 2014-07-28 NOTE — Progress Notes (Signed)
SPORANOX WAS DENIED BY INSURANCE COMPANY , TRYING [facility]MEL WITH [facility]

## 2014-08-05 ENCOUNTER — Ambulatory Visit (INDEPENDENT_AMBULATORY_CARE_PROVIDER_SITE_OTHER): Payer: BLUE CROSS/BLUE SHIELD | Admitting: Podiatry

## 2014-08-05 VITALS — BP 134/90 | HR 86 | Resp 16

## 2014-08-05 DIAGNOSIS — L603 Nail dystrophy: Secondary | ICD-10-CM

## 2014-08-05 DIAGNOSIS — Z79899 Other long term (current) drug therapy: Secondary | ICD-10-CM

## 2014-08-05 MED ORDER — ITRACONAZOLE 200 MG PO TABS: 200.0000 mg | ORAL_TABLET | Freq: Every day | ORAL | Status: DC

## 2014-08-05 NOTE — Progress Notes (Signed)
He presents today for what was supposed to be his first month follow-up after the use of Sporanox. He was unable to get the Sporanox in a timely manner is started itraconazole just 5 days ago. States that he has no ill effects as of yet. He also states that his plantar fasciitis is 100% resolved for his left foot.  Objective: Vital signs are stable he is alert and oriented 3 no pain on palpation medial calcaneal tubercle of the left foot.  Assessment: Resolving plantar fasciitis left foot. Onychomycosis with oral treatment.  Plan: We will had eroded a prescription for a liver profile to be performed when he finishes up his 1 month itraconazole. I will then try to get him another 2-3 months of itraconazole at that time.

## 2015-03-17 DIAGNOSIS — J111 Influenza due to unidentified influenza virus with other respiratory manifestations: Secondary | ICD-10-CM

## 2015-03-17 HISTORY — DX: Influenza due to unidentified influenza virus with other respiratory manifestations: J11.1

## 2015-03-18 ENCOUNTER — Encounter: Payer: Self-pay | Admitting: *Deleted

## 2015-03-24 ENCOUNTER — Encounter: Payer: Self-pay | Admitting: General Surgery

## 2015-03-24 ENCOUNTER — Ambulatory Visit (INDEPENDENT_AMBULATORY_CARE_PROVIDER_SITE_OTHER): Payer: 59 | Admitting: General Surgery

## 2015-03-24 VITALS — BP 124/76 | HR 78 | Resp 14 | Ht 75.0 in | Wt 263.0 lb

## 2015-03-24 DIAGNOSIS — K4090 Unilateral inguinal hernia, without obstruction or gangrene, not specified as recurrent: Secondary | ICD-10-CM | POA: Diagnosis not present

## 2015-03-24 DIAGNOSIS — K429 Umbilical hernia without obstruction or gangrene: Secondary | ICD-10-CM

## 2015-03-24 NOTE — Progress Notes (Signed)
Patient ID: Albert Houston, male   DOB: 02/15/1975, 39 y.o.   MRN: [id number]  Chief Complaint  Patient presents with  . Other    left inguinal hernia    HPI Brayton R Barberi is a 39 y.o. male here today for a evaluation of a left inguinal hernia. Patient states he noticed this area about two years ago. No change in size. He states the area burns with activity.  I have reviewed the history of present illness with the patient. HPI  History reviewed. No pertinent past medical history.  Past Surgical History  Procedure Laterality Date  . Mandible fracture surgery  2005  . Tympanostomy tube placement      As a Child    Family History  Problem Relation Age of Onset  . Arthritis Maternal Grandmother   . Hypertension Maternal Grandmother   . Diabetes Paternal Grandfather     Social History Social History  Substance Use Topics  . Smoking status: Current Every Day Smoker -- 3.00 packs/day  . Smokeless tobacco: Never Used     Comment: Wants to try patches  . Alcohol Use: 0.6 oz/week    1 Cans of beer per week    No Known Allergies  No current outpatient prescriptions on file.   No current facility-administered medications for this visit.    Review of Systems Review of Systems  Constitutional: Negative.   Respiratory: Negative.   Cardiovascular: Negative.     Blood pressure 124/76, pulse 78, resp. rate 14, height 6' 3" (1.905 m), weight 263 lb (119.296 kg).  Physical Exam Physical Exam  Constitutional: He is oriented to person, place, and time. He appears well-developed and well-nourished.  Eyes: Conjunctivae are normal. No scleral icterus.  Neck: Neck supple.  Cardiovascular: Normal rate, regular rhythm and normal heart sounds.   Pulmonary/Chest: Effort normal and breath sounds normal.  Abdominal: Soft. Normal appearance and bowel sounds are normal. There is no tenderness. A hernia (small umbilical hernia ) is present. Hernia confirmed positive in the left inguinal  area. Hernia confirmed negative in the right inguinal area.  Lymphadenopathy:    He has no cervical adenopathy.  Neurological: He is alert and oriented to person, place, and time.  Skin: Skin is warm and dry.    Data Reviewed Notes reviewed   Assessment    Left inguinal hernia and umbilical hernia     Plan    Hernia precautions and incarceration were discussed with the patient. If they develop symptoms of an incarcerated hernia, they were encouraged to seek prompt medical attention.  I have recommended repair of the hernia using mesh on an outpatient basis in the near future. The risk of infection was reviewed. The role of prosthetic mesh to minimize the risk of recurrence was reviewed.  Both laparoscopic and open approaches explained. Pt is agreeable to laparoscopy. Umbilical hernia can be repaired at same time  Patient's surgery has been scheduled for 04-01-15 at [facility].    PCP:  Doss, Carrie  This information has been scribed by Jessica Qualls CMA.    Blessed Girdner G 03/24/2015, 12:01 PM    

## 2015-03-24 NOTE — Patient Instructions (Addendum)

## 2015-03-26 ENCOUNTER — Encounter
Admission: RE | Admit: 2015-03-26 | Discharge: 2015-03-26 | Disposition: A | Discharge status: Home / Self Care | Payer: 59 | Source: Ambulatory Visit | Attending: General Surgery | Admitting: General Surgery

## 2015-03-26 DIAGNOSIS — Z01812 Encounter for preprocedural laboratory examination: Secondary | ICD-10-CM | POA: Diagnosis present

## 2015-03-26 DIAGNOSIS — K429 Umbilical hernia without obstruction or gangrene: Secondary | ICD-10-CM | POA: Diagnosis present

## 2015-03-26 DIAGNOSIS — M545 Low back pain: Secondary | ICD-10-CM | POA: Insufficient documentation

## 2015-03-26 DIAGNOSIS — K4090 Unilateral inguinal hernia, without obstruction or gangrene, not specified as recurrent: Secondary | ICD-10-CM | POA: Insufficient documentation

## 2015-03-26 DIAGNOSIS — F17200 Nicotine dependence, unspecified, uncomplicated: Secondary | ICD-10-CM | POA: Insufficient documentation

## 2015-03-26 HISTORY — DX: Influenza due to unidentified influenza virus with other respiratory manifestations: J11.1

## 2015-03-26 HISTORY — DX: Headache, unspecified: R51.9

## 2015-03-26 HISTORY — DX: Headache: R51

## 2015-03-26 LAB — SURGICAL PCR SCREEN
MRSA, PCR: NEGATIVE
Staphylococcus aureus: NEGATIVE

## 2015-03-26 NOTE — Patient Instructions (Signed)
  Your procedure is scheduled on: 04-01-15 (THURSDAY) Report to [facility] 2ND FLOOR To find out your arrival time please call [phone] between 1PM - 3PM on 03-31-15 [facility](WEDNESDAY)  Remember: Instructions that are not followed completely may result in serious medical risk, up to and including death, or upon the discretion of your surgeon and anesthesiologist your surgery may need to be rescheduled.    _X___ 1. Do not eat food or drink liquids after midnight. No gum chewing or hard candies.     _X___ 2. No Alcohol for 24 hours before or after surgery.   ____ 3. Bring all medications with you on the day of surgery if instructed.    _X___ 4. Notify your doctor if there is any change in your medical condition     (cold, fever, infections).     Do not wear jewelry, make-up, hairpins, clips or nail polish.  Do not wear lotions, powders, or perfumes. You may wear deodorant.  Do not shave 48 hours prior to surgery. Men may shave face and neck.  Do not bring valuables to the hospital.    [facility] any belongings or valuables.               Contacts, dentures or bridgework may not be worn into surgery.  Leave your suitcase in the car. After surgery it may be brought to your room.  For patients admitted to the hospital, discharge time is determined by your  treatment team.   Patients discharged the day of surgery will not be allowed to drive home.   Please read over the following fact sheets that you were given:      ____ Take these medicines the morning of surgery with A SIP OF WATER:    1. NONE  2.   3.   4.  5.  6.  ____ Fleet Enema (as directed)   _X___ Use CHG Soap as directed  ____ Use inhalers on the day of surgery  ____ Stop metformin 2 days prior to surgery    ____ Take 1/2 of usual insulin dose the night before surgery and none on the morning of surgery.   ____ Stop Coumadin/Plavix/aspirin-N/A  ____ Stop  Anti-inflammatories-NO NSAIDS OR ASPIRIN PRODUCTS-TYLENOL OK TO TAKE   ____ Stop supplements until after surgery.    ____ Bring C-Pap to the hospital.

## 2015-04-01 ENCOUNTER — Encounter: Payer: Self-pay | Admitting: *Deleted

## 2015-04-01 ENCOUNTER — Ambulatory Visit: Payer: 59 | Admitting: Anesthesiology

## 2015-04-01 ENCOUNTER — Ambulatory Visit
Admission: RE | Admit: 2015-04-01 | Discharge: 2015-04-01 | Disposition: A | Discharge status: Home / Self Care | Payer: 59 | Source: Ambulatory Visit | Attending: General Surgery | Admitting: General Surgery

## 2015-04-01 ENCOUNTER — Encounter
Admission: RE | Disposition: A | Discharge status: Home / Self Care | Payer: Self-pay | Source: Ambulatory Visit | Attending: General Surgery

## 2015-04-01 DIAGNOSIS — K429 Umbilical hernia without obstruction or gangrene: Secondary | ICD-10-CM | POA: Diagnosis not present

## 2015-04-01 DIAGNOSIS — Z8261 Family history of arthritis: Secondary | ICD-10-CM | POA: Diagnosis not present

## 2015-04-01 DIAGNOSIS — K4090 Unilateral inguinal hernia, without obstruction or gangrene, not specified as recurrent: Secondary | ICD-10-CM | POA: Insufficient documentation

## 2015-04-01 DIAGNOSIS — Z8249 Family history of ischemic heart disease and other diseases of the circulatory system: Secondary | ICD-10-CM | POA: Insufficient documentation

## 2015-04-01 DIAGNOSIS — Z833 Family history of diabetes mellitus: Secondary | ICD-10-CM | POA: Insufficient documentation

## 2015-04-01 DIAGNOSIS — K660 Peritoneal adhesions (postprocedural) (postinfection): Secondary | ICD-10-CM | POA: Insufficient documentation

## 2015-04-01 DIAGNOSIS — F17200 Nicotine dependence, unspecified, uncomplicated: Secondary | ICD-10-CM | POA: Diagnosis not present

## 2015-04-01 HISTORY — PX: UMBILICAL HERNIA REPAIR: SHX196

## 2015-04-01 HISTORY — PX: INGUINAL HERNIA REPAIR: SHX194

## 2015-04-01 SURGERY — REPAIR, HERNIA, INGUINAL, LAPAROSCOPIC
Anesthesia: General

## 2015-04-01 MED ORDER — CEFAZOLIN SODIUM-DEXTROSE 2-4 GM/100ML-% IV SOLN
2.0000 g | INTRAVENOUS | Status: DC
  Filled 2015-04-01: qty 100

## 2015-04-01 MED ORDER — PROPOFOL 10 MG/ML IV BOLUS
INTRAVENOUS | Status: DC | PRN
  Administered 2015-04-01: 50 mg via INTRAVENOUS
  Administered 2015-04-01: 200 mg via INTRAVENOUS

## 2015-04-01 MED ORDER — SODIUM CHLORIDE 0.9 % IR SOLN
Status: DC | PRN
  Administered 2015-04-01: 1 mL

## 2015-04-01 MED ORDER — DEXAMETHASONE SODIUM PHOSPHATE 4 MG/ML IJ SOLN
INTRAMUSCULAR | Status: DC | PRN
  Administered 2015-04-01: 10 mg via INTRAVENOUS

## 2015-04-01 MED ORDER — FAMOTIDINE 20 MG PO TABS
ORAL_TABLET | ORAL | Status: AC
  Administered 2015-04-01: 20 mg via ORAL
  Filled 2015-04-01: qty 1

## 2015-04-01 MED ORDER — OXYCODONE-ACETAMINOPHEN 5-325 MG PO TABS
1.0000 | ORAL_TABLET | ORAL | Status: DC | PRN
  Administered 2015-04-01: 1 via ORAL

## 2015-04-01 MED ORDER — FENTANYL CITRATE (PF) 100 MCG/2ML IJ SOLN
INTRAMUSCULAR | Status: AC
  Administered 2015-04-01: 25 ug via INTRAVENOUS
  Filled 2015-04-01: qty 2

## 2015-04-01 MED ORDER — ROCURONIUM BROMIDE 100 MG/10ML IV SOLN
INTRAVENOUS | Status: DC | PRN
  Administered 2015-04-01: 10 mg via INTRAVENOUS
  Administered 2015-04-01: 5 mg via INTRAVENOUS
  Administered 2015-04-01 (×3): 10 mg via INTRAVENOUS
  Administered 2015-04-01: 35 mg via INTRAVENOUS

## 2015-04-01 MED ORDER — LIDOCAINE HCL (PF) 1 % IJ SOLN
INTRAMUSCULAR | Status: AC
  Filled 2015-04-01: qty 30

## 2015-04-01 MED ORDER — CEFAZOLIN SODIUM 1-5 GM-% IV SOLN
INTRAVENOUS | Status: DC | PRN
  Administered 2015-04-01: 2 g via INTRAVENOUS

## 2015-04-01 MED ORDER — FENTANYL CITRATE (PF) 100 MCG/2ML IJ SOLN
25.0000 ug | INTRAMUSCULAR | Status: AC | PRN
  Administered 2015-04-01 (×6): 25 ug via INTRAVENOUS

## 2015-04-01 MED ORDER — CHLORHEXIDINE GLUCONATE 4 % EX LIQD: 1.0000 "application " | Freq: Once | CUTANEOUS | Status: DC

## 2015-04-01 MED ORDER — BUPIVACAINE HCL (PF) 0.5 % IJ SOLN
INTRAMUSCULAR | Status: AC
  Filled 2015-04-01: qty 30

## 2015-04-01 MED ORDER — OXYCODONE-ACETAMINOPHEN 5-325 MG PO TABS
ORAL_TABLET | ORAL | Status: AC
  Administered 2015-04-01: 1 via ORAL
  Filled 2015-04-01: qty 1

## 2015-04-01 MED ORDER — BUPIVACAINE HCL (PF) 0.5 % IJ SOLN
INTRAMUSCULAR | Status: DC | PRN
  Administered 2015-04-01: 16 mL

## 2015-04-01 MED ORDER — ONDANSETRON HCL 4 MG/2ML IJ SOLN
INTRAMUSCULAR | Status: DC | PRN
  Administered 2015-04-01: 4 mg via INTRAVENOUS

## 2015-04-01 MED ORDER — LACTATED RINGERS IV SOLN
INTRAVENOUS | Status: DC
  Administered 2015-04-01 (×2): via INTRAVENOUS

## 2015-04-01 MED ORDER — CEFAZOLIN SODIUM-DEXTROSE 2-3 GM-% IV SOLR
INTRAVENOUS | Status: AC
  Administered 2015-04-01: 2000 mg
  Filled 2015-04-01: qty 50

## 2015-04-01 MED ORDER — ACETAMINOPHEN 10 MG/ML IV SOLN
INTRAVENOUS | Status: AC
  Filled 2015-04-01: qty 100

## 2015-04-01 MED ORDER — MIDAZOLAM HCL 2 MG/2ML IJ SOLN
INTRAMUSCULAR | Status: DC | PRN
  Administered 2015-04-01: 2 mg via INTRAVENOUS

## 2015-04-01 MED ORDER — LIDOCAINE HCL (CARDIAC) 20 MG/ML IV SOLN
INTRAVENOUS | Status: DC | PRN
  Administered 2015-04-01: 100 mg via INTRAVENOUS

## 2015-04-01 MED ORDER — OXYCODONE-ACETAMINOPHEN 5-325 MG PO TABS: 1.0000 | ORAL_TABLET | ORAL | Status: DC | PRN

## 2015-04-01 MED ORDER — SUGAMMADEX SODIUM 200 MG/2ML IV SOLN
INTRAVENOUS | Status: DC | PRN
  Administered 2015-04-01: 238.6 mg via INTRAVENOUS

## 2015-04-01 MED ORDER — SUCCINYLCHOLINE CHLORIDE 20 MG/ML IJ SOLN
INTRAMUSCULAR | Status: DC | PRN
  Administered 2015-04-01: 100 mg via INTRAVENOUS

## 2015-04-01 MED ORDER — PROMETHAZINE HCL 25 MG/ML IJ SOLN: 6.2500 mg | INTRAMUSCULAR | Status: DC | PRN

## 2015-04-01 MED ORDER — ALBUTEROL SULFATE HFA 108 (90 BASE) MCG/ACT IN AERS
INHALATION_SPRAY | RESPIRATORY_TRACT | Status: DC | PRN
  Administered 2015-04-01: 6 via RESPIRATORY_TRACT

## 2015-04-01 MED ORDER — DEXMEDETOMIDINE HCL 200 MCG/2ML IV SOLN
INTRAVENOUS | Status: DC | PRN
  Administered 2015-04-01: 8 ug via INTRAVENOUS

## 2015-04-01 MED ORDER — FENTANYL CITRATE (PF) 100 MCG/2ML IJ SOLN
50.0000 ug | Freq: Once | INTRAMUSCULAR | Status: AC
  Administered 2015-04-01: 50 ug via INTRAVENOUS

## 2015-04-01 MED ORDER — FENTANYL CITRATE (PF) 100 MCG/2ML IJ SOLN
INTRAMUSCULAR | Status: DC | PRN
  Administered 2015-04-01: 50 ug via INTRAVENOUS
  Administered 2015-04-01: 100 ug via INTRAVENOUS
  Administered 2015-04-01: 150 ug via INTRAVENOUS
  Administered 2015-04-01: 50 ug via INTRAVENOUS

## 2015-04-01 MED ORDER — FAMOTIDINE 20 MG PO TABS
20.0000 mg | ORAL_TABLET | Freq: Once | ORAL | Status: AC
  Administered 2015-04-01: 20 mg via ORAL

## 2015-04-01 MED ORDER — ACETAMINOPHEN 10 MG/ML IV SOLN
INTRAVENOUS | Status: DC | PRN
  Administered 2015-04-01: 1000 mg via INTRAVENOUS

## 2015-04-01 SURGICAL SUPPLY — 44 items
BLADE SURG 11 STRL SS SAFETY (MISCELLANEOUS) ×3 IMPLANT
BLADE SURG 15 STRL SS SAFETY (BLADE) ×3 IMPLANT
CANISTER SUCT 1200ML W/VALVE (MISCELLANEOUS) ×3 IMPLANT
CATH TRAY 16F METER LATEX (MISCELLANEOUS) ×3 IMPLANT
CHLORAPREP W/TINT 26ML (MISCELLANEOUS) ×3 IMPLANT
DECANTER SPIKE VIAL GLASS SM (MISCELLANEOUS) ×3 IMPLANT
DEFOGGER SCOPE WARMER CLEARIFY (MISCELLANEOUS) ×3 IMPLANT
DEVICE SECURE STRAP 25 ABSORB (INSTRUMENTS) ×3 IMPLANT
DRAPE INCISE IOBAN 66X45 STRL (DRAPES) ×3 IMPLANT
DRAPE LAPAROTOMY 100X77 ABD (DRAPES) IMPLANT
ELECT REM PT RETURN 9FT ADLT (ELECTROSURGICAL) ×3
ELECTRODE REM PT RTRN 9FT ADLT (ELECTROSURGICAL) ×2 IMPLANT
GLOVE BIO SURGEON STRL SZ7 (GLOVE) ×15 IMPLANT
GOWN STRL REUS W/ TWL LRG LVL3 (GOWN DISPOSABLE) ×8 IMPLANT
GOWN STRL REUS W/TWL LRG LVL3 (GOWN DISPOSABLE) ×4
GRASPER SUT TROCAR 14GX15 (MISCELLANEOUS) ×3 IMPLANT
IRRIGATION STRYKERFLOW (MISCELLANEOUS) IMPLANT
IRRIGATOR STRYKERFLOW (MISCELLANEOUS)
IV LACTATED RINGERS 1000ML (IV SOLUTION) IMPLANT
KIT RM TURNOVER STRD PROC AR (KITS) ×3 IMPLANT
LABEL OR SOLS (LABEL) ×3 IMPLANT
LIQUID BAND (GAUZE/BANDAGES/DRESSINGS) ×3 IMPLANT
MESH 3DMAX 3X5 LT MED (Mesh General) ×3 IMPLANT
NEEDLE HYPO 25X1 1.5 SAFETY (NEEDLE) ×3 IMPLANT
NEEDLE VERESS 14GA 120MM (NEEDLE) ×3 IMPLANT
NS IRRIG 500ML POUR BTL (IV SOLUTION) ×3 IMPLANT
PACK BASIN MINOR ARMC (MISCELLANEOUS) ×3 IMPLANT
PACK LAP CHOLECYSTECTOMY (MISCELLANEOUS) ×3 IMPLANT
SCISSORS METZENBAUM CVD 33 (INSTRUMENTS) ×3 IMPLANT
SHEARS HARMONIC ACE PLUS 36CM (ENDOMECHANICALS) IMPLANT
SLEEVE ENDOPATH XCEL 5M (ENDOMECHANICALS) ×3 IMPLANT
SLEEVE PROTECTION STRL DISP (MISCELLANEOUS) ×3 IMPLANT
SUT PROLENE 0 CT 2 (SUTURE) ×6 IMPLANT
SUT VIC AB 0 CT2 27 (SUTURE) IMPLANT
SUT VIC AB 2-0 SH 27 (SUTURE)
SUT VIC AB 2-0 SH 27XBRD (SUTURE) IMPLANT
SUT VIC AB 3-0 SH 27 (SUTURE)
SUT VIC AB 3-0 SH 27X BRD (SUTURE) IMPLANT
SUT VIC AB 4-0 FS2 27 (SUTURE) ×6 IMPLANT
SUT VICRYL+ 3-0 144IN (SUTURE) IMPLANT
SYR CONTROL 10ML (SYRINGE) ×3 IMPLANT
TROCAR XCEL NON-BLD 11X100MML (ENDOMECHANICALS) ×3 IMPLANT
TROCAR XCEL NON-BLD 5MMX100MML (ENDOMECHANICALS) ×3 IMPLANT
TUBING INSUFFLATOR HI FLOW (MISCELLANEOUS) ×3 IMPLANT

## 2015-04-01 NOTE — Anesthesia Procedure Notes (Signed)
Procedure Name: Intubation Date/Time: 04/01/2015 9:35 AM Performed by: Paulette BlanchPARAS, Kerrie Latour Pre-anesthesia Checklist: Patient identified, Patient being monitored, Timeout performed, Emergency Drugs available and Suction available Patient Re-evaluated:Patient Re-evaluated prior to inductionOxygen Delivery Method: Circle system utilized Preoxygenation: Pre-oxygenation with 100% oxygen Intubation Type: IV induction Ventilation: Mask ventilation without difficulty Laryngoscope Size: 3 and Miller Grade View: Grade I Tube type: Oral Tube size: 7.5 mm Number of attempts: 1 Placement Confirmation: ETT inserted through vocal cords under direct vision,  positive ETCO2 and breath sounds checked- equal and bilateral Secured at: 21 cm Tube secured with: Tape Dental Injury: Teeth and Oropharynx as per pre-operative assessment

## 2015-04-01 NOTE — Discharge Instructions (Signed)

## 2015-04-01 NOTE — Interval H&P Note (Signed)
History and Physical Interval Note:  04/01/2015 9:19 AM  Albert Houston  has presented today for surgery, with the diagnosis of left inguinal hernia,umbilical hernia  The various methods of treatment have been discussed with the patient and family. After consideration of risks, benefits and other options for treatment, the patient has consented to  Procedure(s): LAPAROSCOPIC INGUINAL HERNIA (Left) HERNIA REPAIR UMBILICAL ADULT (N/A) as a surgical intervention .  The patient's history has been reviewed, patient examined, no change in status, stable for surgery.  I have reviewed the patient's chart and labs.  Questions were answered to the patient's satisfaction.     Endi Lagman G

## 2015-04-01 NOTE — Transfer of Care (Signed)
Immediate Anesthesia Transfer of Care Note  Patient: Albert Houston  Procedure(s) Performed: Procedure(s): LAPAROSCOPIC INGUINAL HERNIA (Left) laproscopic umbilical hernia repair (N/A)  Patient Location: PACU  Anesthesia Type:General  Level of Consciousness: awake, alert  and oriented  Airway & Oxygen Therapy: Patient Spontanous Breathing and Patient connected to face mask oxygen  Post-op Assessment: Report given to RN and Post -op Vital signs reviewed and stable  Post vital signs: Reviewed and stable  Last Vitals:  Filed Vitals:   04/01/15 0831  BP: 144/103  Pulse: 87  Temp: 36.6 C  Resp: 15    Complications: No apparent anesthesia complications

## 2015-04-01 NOTE — Anesthesia Preprocedure Evaluation (Addendum)
Anesthesia Evaluation  Patient identified by MRN, date of birth, ID band Patient awake    Reviewed: Allergy & Precautions, H&P , NPO status , Patient's Chart, lab work & pertinent test results, reviewed documented beta blocker date and time   History of Anesthesia Complications Negative for: history of anesthetic complications  Airway Mallampati: I  TM Distance: >3 FB Neck ROM: full    Dental no notable dental hx. (+) Edentulous Lower, Lower Dentures, Missing, Poor Dentition   Pulmonary neg shortness of breath, asthma (mild, intermittent) , neg sleep apnea, neg COPD, neg recent URI, Current Smoker,    Pulmonary exam normal breath sounds clear to auscultation       Cardiovascular Exercise Tolerance: Good negative cardio ROS Normal cardiovascular exam Rhythm:regular Rate:Normal     Neuro/Psych negative neurological ROS  negative psych ROS   GI/Hepatic negative GI ROS, Neg liver ROS,   Endo/Other  negative endocrine ROS  Renal/GU negative Renal ROS  negative genitourinary   Musculoskeletal   Abdominal   Peds  Hematology negative hematology ROS (+)   Anesthesia Other Findings Past Medical History:   Flu                                             03-17-15         Comment:RESOLVED NOW (03-26-15)   Headache                                                       Comment:h/o migraine   Reproductive/Obstetrics negative OB ROS                            Anesthesia Physical Anesthesia Plan  ASA: II  Anesthesia Plan: General   Post-op Pain Management:    Induction:   Airway Management Planned:   Additional Equipment:   Intra-op Plan:   Post-operative Plan:   Informed Consent: I have reviewed the patients History and Physical, chart, labs and discussed the procedure including the risks, benefits and alternatives for the proposed anesthesia with the patient or authorized representative who  has indicated his/her understanding and acceptance.   Dental Advisory Given  Plan Discussed with: Anesthesiologist, CRNA and Surgeon  Anesthesia Plan Comments:         Anesthesia Quick Evaluation

## 2015-04-01 NOTE — Op Note (Signed)
Preop diagnosis: 1. left inguinal hernia.2. umbilical hernia  Post op diagnosis: Same  Operation: Laparoscopy repair left inguinal hernia with mesh and repair umbilical hernia  Surgeon: S.G.Evah Rashid   Assistant:     Anesthesia: Gen.  Complications: None  EBL: Less than 15 mL  Drains: None  Description: Patient was put to sleep in supine position the operating table. Foley catheter was inserted and this was removed at the end of the procedure. The abdomen was prepped and draped as sterile field and timeout was performed. Patient had a small umbilical hernia with a fingerbreadth or less fascial defect. A curved incision along the left lip of the umbilicus was made and deepened through to expose a small hernial opening the fascia about the 8-10 8-10 mm in size. The Veress needle was positioned through this defect into the peritoneal cavity and verified of the hanging drop method and pneumoperitoneum was then obtained. 11 mm port was then positioned through this and the camera was introduced. Patient was noted to have a sizable left inguinal hernia that wasn't indirect variety with the sigmoid colon tending to herniate into with. It was also adherent to the proximal portion of the sac. 2 lateral 5 mm ports were placed one in the left flank area and one in the left upper quadrant. The adhesion of the sigmoid colon to the hernial sac was freed with the use of cautery. The peritoneum was scored along the's superior aspect of the inguinal canal from the lateral to the medial aspect. And the retroperitoneal space and this area was then dissected until the pubic tubercle and the inguinal ligament are identified. The sac was then freed along with what appeared to be a fairly large lipoma of the cord both of these were pushed back towards the peritoneal cavity. After this area was adequately dissected and freed mesh repair was obtained. A Bard 3-D mesh, medium size was position in the peritoneal cavity and the  tach medial and was tacked to the pubic tubercle with the secure strap device. This was then tacked along the inferior edge with to the inguinal ligament and a few along the superior and medial aspects of the  Mesh. Repair was adequate. The peritoneal opening was then reapproximated also with the tacker. Following this the umbilical port was removed and using a suture passer device of 0 proline stitch was used to close the fascial defect at the umbilical hernia site. A single stitch was adequate to close this opening. Remaining ports removed after release of pneumoperitoneum. Skin incisions were closed with subcuticular 4-0 Vicryl and covered with the liquid ban. Also 15 mL of 0.5% Marcaine was instilled in the port sites for postop analgesia. Patient subsequently was extubated and returned recovery room in stable condition.

## 2015-04-01 NOTE — Anesthesia Postprocedure Evaluation (Signed)
Anesthesia Post Note  Patient: Albert Houston  Procedure(s) Performed: Procedure(s) (LRB): LAPAROSCOPIC INGUINAL HERNIA (Left) laproscopic umbilical hernia repair (N/A)  Patient location during evaluation: PACU Anesthesia Type: General Level of consciousness: awake and alert Pain management: pain level controlled Vital Signs Assessment: post-procedure vital signs reviewed and stable Respiratory status: spontaneous breathing, nonlabored ventilation, respiratory function stable and patient connected to nasal cannula oxygen Cardiovascular status: blood pressure returned to baseline and stable Postop Assessment: no signs of nausea or vomiting Anesthetic complications: no    Last Vitals:  Filed Vitals:   04/01/15 1253 04/01/15 1309  BP: 129/72 143/78  Pulse: 95 93  Temp:  36.8 C  Resp: 13 16    Last Pain:  Filed Vitals:   04/01/15 1312  PainSc: 5                  Lenard SimmerAndrew Nial Hawe

## 2015-04-01 NOTE — H&P (View-Only) (Signed)
Patient ID: Albert Houston, male   DOB: 1975-10-05, 40 y.o.   MRN: [id number]  Chief Complaint  Patient presents with  . Other    left inguinal hernia    HPI Albert Houston is a 40 y.o. male here today for a evaluation of a left inguinal hernia. Patient states he noticed this area about two years ago. No change in size. He states the area burns with activity.  I have reviewed the history of present illness with the patient. HPI  History reviewed. No pertinent past medical history.  Past Surgical History  Procedure Laterality Date  . Mandible fracture surgery  2005  . Tympanostomy tube placement      As a Child    Family History  Problem Relation Age of Onset  . Arthritis Maternal Grandmother   . Hypertension Maternal Grandmother   . Diabetes Paternal Grandfather     Social History Social History  Substance Use Topics  . Smoking status: Current Every Day Smoker -- 3.00 packs/day  . Smokeless tobacco: Never Used     Comment: Wants to try patches  . Alcohol Use: 0.6 oz/week    1 Cans of beer per week    No Known Allergies  No current outpatient prescriptions on file.   No current facility-administered medications for this visit.    Review of Systems Review of Systems  Constitutional: Negative.   Respiratory: Negative.   Cardiovascular: Negative.     Blood pressure 124/76, pulse 78, resp. rate 14, height 6\' 3"  (1.905 m), weight 263 lb (119.296 kg).  Physical Exam Physical Exam  Constitutional: He is oriented to person, place, and time. He appears well-developed and well-nourished.  Eyes: Conjunctivae are normal. No scleral icterus.  Neck: Neck supple.  Cardiovascular: Normal rate, regular rhythm and normal heart sounds.   Pulmonary/Chest: Effort normal and breath sounds normal.  Abdominal: Soft. Normal appearance and bowel sounds are normal. There is no tenderness. A hernia (small umbilical hernia ) is present. Hernia confirmed positive in the left inguinal  area. Hernia confirmed negative in the right inguinal area.  Lymphadenopathy:    He has no cervical adenopathy.  Neurological: He is alert and oriented to person, place, and time.  Skin: Skin is warm and dry.    Data Reviewed Notes reviewed   Assessment    Left inguinal hernia and umbilical hernia     Plan    Hernia precautions and incarceration were discussed with the patient. If they develop symptoms of an incarcerated hernia, they were encouraged to seek prompt medical attention.  I have recommended repair of the hernia using mesh on an outpatient basis in the near future. The risk of infection was reviewed. The role of prosthetic mesh to minimize the risk of recurrence was reviewed.  Both laparoscopic and open approaches explained. Pt is agreeable to laparoscopy. Umbilical hernia can be repaired at same time  [facility]    PCP:  Naomie Houston, Albert  This information has been scribed by Albert Houston.    Albert Houston 03/24/2015, 12:01 PM

## 2015-04-05 ENCOUNTER — Encounter: Payer: Self-pay | Admitting: General Surgery

## 2015-04-08 ENCOUNTER — Ambulatory Visit (INDEPENDENT_AMBULATORY_CARE_PROVIDER_SITE_OTHER): Payer: 59 | Admitting: General Surgery

## 2015-04-08 ENCOUNTER — Encounter: Payer: Self-pay | Admitting: General Surgery

## 2015-04-08 VITALS — BP 150/86 | HR 94 | Resp 16 | Ht 75.0 in | Wt 261.0 lb

## 2015-04-08 DIAGNOSIS — K4090 Unilateral inguinal hernia, without obstruction or gangrene, not specified as recurrent: Secondary | ICD-10-CM

## 2015-04-08 DIAGNOSIS — K429 Umbilical hernia without obstruction or gangrene: Secondary | ICD-10-CM

## 2015-04-08 NOTE — Progress Notes (Signed)
Patient ID: Albert BankerJames R Houston, male   DOB: 1975/06/24, 40 y.o.   MRN: [id number]  Chief Complaint  Patient presents with  . Routine Post Op    umbilical repair    HPI Albert Houston is a 40 y.o. male here today for a post op umbilical and left inguinal hernia repair done on 04/01/15. Patient states he is doing well.  HPI  Past Medical History  Diagnosis Date  . Flu 03-17-15    RESOLVED NOW (03-26-15)  . Headache     h/o migraine    Past Surgical History  Procedure Laterality Date  . Mandible fracture surgery  2005  . Tympanostomy tube placement      As a Child  . Inguinal hernia repair Left 04/01/2015    Procedure: LAPAROSCOPIC INGUINAL HERNIA;  Surgeon: Kieth BrightlySeeplaputhur G Garl Speigner, MD;  Location: [facility] ORS;  Service: General;  Laterality: Left;  . Umbilical hernia repair N/A 04/01/2015    Procedure: laproscopic umbilical hernia repair;  Surgeon: Kieth BrightlySeeplaputhur G Gertie Broerman, MD;  Location: [facility] ORS;  Service: General;  Laterality: N/A;    Family History  Problem Relation Age of Onset  . Arthritis Maternal Grandmother   . Hypertension Maternal Grandmother   . Diabetes Paternal Grandfather     Social History Social History  Substance Use Topics  . Smoking status: Current Some Day Smoker -- 1.50 packs/day for 12 years    Types: Cigarettes  . Smokeless tobacco: Never Used     Comment: using patch currently but if he forgets patch he will smoke   . Alcohol Use: 0.6 oz/week    1 Cans of beer per week     Comment: rare    No Known Allergies  Current Outpatient Prescriptions  Medication Sig Dispense Refill  . oxyCODONE-acetaminophen (ROXICET) 5-325 MG tablet Take 1 tablet by mouth every 4 (four) hours as needed. (Patient not taking: Reported on 04/08/2015) 30 tablet 0   No current facility-administered medications for this visit.    Review of Systems Review of Systems  Constitutional: Negative.   Respiratory: Negative.   Cardiovascular: Negative.     Blood pressure 150/86, pulse 94,  resp. rate 16, height 6\' 3"  (1.905 m), weight 261 lb (118.389 kg).  Physical Exam Physical Exam  Constitutional: He is oriented to person, place, and time. He appears well-developed and well-nourished.  Abdominal: Soft. Normal appearance and bowel sounds are normal. There is no hepatomegaly. There is no tenderness.  Port sites  are healing well. Repairs are intact  Neurological: He is alert and oriented to person, place, and time.  Skin: Skin is warm and dry.    Data Reviewed Notes reviewed  Assessment   Post op left inguinal hernia repair and umbilical hernia repair. Doing well. After 1 more week he can increase activity as tolerated.        Plan    Patient to return in five weeks.    PCP:  Naomie Deanoss, Carrie  This information has been scribed by Ples SpecterJessica Qualls CMA.    Giancarlo Askren G 04/14/2015, 8:05 AM

## 2015-04-08 NOTE — Patient Instructions (Signed)
Patient to return in five weeks.  

## 2015-04-14 ENCOUNTER — Encounter: Payer: Self-pay | Admitting: General Surgery

## 2015-04-29 ENCOUNTER — Encounter: Payer: Self-pay | Admitting: *Deleted

## 2015-05-12 ENCOUNTER — Ambulatory Visit (INDEPENDENT_AMBULATORY_CARE_PROVIDER_SITE_OTHER): Payer: 59 | Admitting: General Surgery

## 2015-05-12 ENCOUNTER — Ambulatory Visit: Payer: 59 | Admitting: General Surgery

## 2015-05-12 ENCOUNTER — Encounter: Payer: Self-pay | Admitting: General Surgery

## 2015-05-12 VITALS — BP 120/74 | HR 80 | Resp 16 | Ht 75.0 in | Wt 259.0 lb

## 2015-05-12 DIAGNOSIS — K429 Umbilical hernia without obstruction or gangrene: Secondary | ICD-10-CM

## 2015-05-12 DIAGNOSIS — K4090 Unilateral inguinal hernia, without obstruction or gangrene, not specified as recurrent: Secondary | ICD-10-CM

## 2015-05-12 NOTE — Progress Notes (Signed)
Here today for postoperative visit, umbilical and left inguinal hernia repair on 04-01-15. He states he is doing well.  Incisions are well healed. Abdomen is soft and non-tender. Umbilical and left inguinal hernias are intact.  Patient can return to normal activity. No restrictions Patient to call if any new problems   PCP:  Naomie Deanoss, Carrie  This information has been scribed by Dorathy DaftMarsha Hatch RN, BSN,BC.

## 2015-05-12 NOTE — Patient Instructions (Signed)
The patient is aware to call back for any questions or concerns.  

## 2016-04-28 ENCOUNTER — Telehealth: Payer: Self-pay | Admitting: Nurse Practitioner

## 2016-04-28 ENCOUNTER — Ambulatory Visit (INDEPENDENT_AMBULATORY_CARE_PROVIDER_SITE_OTHER): Payer: 59 | Admitting: Internal Medicine

## 2016-04-28 ENCOUNTER — Encounter: Payer: Self-pay | Admitting: Internal Medicine

## 2016-04-28 VITALS — BP 104/60 | HR 96 | Temp 97.6°F | Wt 283.0 lb

## 2016-04-28 DIAGNOSIS — M7022 Olecranon bursitis, left elbow: Secondary | ICD-10-CM | POA: Diagnosis not present

## 2016-04-28 MED ORDER — PREDNISONE 10 MG PO TABS: ORAL_TABLET | ORAL | 0 refills | Status: DC

## 2016-04-28 NOTE — Telephone Encounter (Signed)
Patient Name: DEREON CORKERY DOB: 1975/10/17 Initial Comment Caller has had fluid built up in his left elbow for three months. In the past three weeks it has been moving up into his shoulder and causing pain. Nurse Assessment Nurse: Katrina Stack RN, Dahlia Client Date/Time (Eastern Time): 04/28/2016 1:43:03 PM Confirm and document reason for call. If symptomatic, describe symptoms. ---Caller states he has had fluid build up in his left elbow that has caused some pain between his elbow and shoulder over the past few weeks. Unsure of how it happened to his elbow. Just noticed it one day after work. Does the patient have any new or worsening symptoms? ---Yes Will a triage be completed? ---Yes Related visit to physician within the last 2 weeks? ---No Does the PT have any chronic conditions? (i.e. diabetes, asthma, etc.) ---No Is this a behavioral health or substance abuse call? ---No Guidelines Guideline Title Affirmed Question Affirmed Notes Arm Pain Numbness (i.e., loss of sensation) in hand or fingers Final Disposition User See Physician within 24 Hours [place], RN, Dahlia Client Comments No appt available with PCP or at primary office, Appt scheduled for today at 4:15 at [facility] location with Nicki Reaper, NP Referrals REFERRED TO PCP OFFICE Disagree/Comply: Comply

## 2016-04-28 NOTE — Patient Instructions (Signed)
Elbow Bursitis  A bursa is a fluid-filled sac that covers and protects a joint. Bursitis is when the fluid-filled sac gets puffy and sore (inflamed). Elbow bursitis, also called olecranon bursitis, happens over your elbow. This may be caused by:  · Injury (acute trauma) to your elbow.  · Leaning on hard surfaces for long periods of time.  · Infection from an injury that breaks the skin near your elbow.  · A bone growth (spur) that forms at the tip of your elbow.  · A medical condition that causes inflammation in your body, such as:  ? Gout.  ? Rheumatoid arthritis.    Sometimes the cause is not known.  Follow these instructions at home:  · Take medicines only as told by your doctor.  · If you were prescribed an antibiotic medicine, finish all of it even if you start to feel better.  · If your bursitis is caused by an injury, rest your elbow and wear your bandage as told by your doctor. You may also apply ice to the injured area as told by your doctor:  ? Put ice in a plastic bag.  ? Place a towel between your skin and the bag.  ? Leave the ice on for 20 minutes, 2-3 times per day.  · Do not do any activities that cause pain to your elbow.  · Use elbow pads or wraps to cushion your elbow.  Contact a doctor if:  · You have a fever.  · Your symptoms do not get better with treatment.  · Your pain or swelling gets worse.  · Your pain or swelling goes away and then comes back.  · You have drainage of pus from the swollen area over your elbow.  This information is not intended to replace advice given to you by your health care provider. Make sure you discuss any questions you have with your health care provider.  Document Released: 06/15/2009 Document Revised: 06/03/2015 Document Reviewed: 09/03/2013  Elsevier Interactive Patient Education © 2017 [employer]

## 2016-04-28 NOTE — Telephone Encounter (Signed)
FYI has not reestablished care with any Provider here, seeing Baity at [facility], thanks

## 2016-04-28 NOTE — Progress Notes (Signed)
Subjective:    Patient ID: Albert Houston, male    DOB: 02/11/1975, 41 y.o.   MRN: [id number]  HPI  Pt presents to the clinic today with c/o swelling of his left elbow. He noticed this a few months ago. The swelling has gotten worse over the last few weeks. He reports the swelling is causing pain that radiates up to his shoulder. He denies any injury or trauma to the elbow. He has tried Ibuprofen without much relief.  Review of Systems  Past Medical History:  Diagnosis Date  . Flu 03-17-15   RESOLVED NOW (03-26-15)  . Headache    h/o migraine    No current outpatient prescriptions on file.   No current facility-administered medications for this visit.     No Known Allergies  Family History  Problem Relation Age of Onset  . Arthritis Maternal Grandmother   . Hypertension Maternal Grandmother   . Diabetes Paternal Grandfather     Social History   Social History  . Marital status: Married    Spouse name: N/A  . Number of children: N/A  . Years of education: N/A   Occupational History  . Not on file.   Social History Main Topics  . Smoking status: Current Some Day Smoker    Packs/day: 1.50    Years: 12.00    Types: Cigarettes  . Smokeless tobacco: Never Used     Comment: using patch currently but if he forgets patch he will smoke   . Alcohol use 0.6 oz/week    1 Cans of beer per week     Comment: rare  . Drug use: No  . Sexual activity: Not on file   Other Topics Concern  . Not on file   Social History Narrative   Lives at home with wife   54 (Son) year old and 108 (daughter) year old    Works at [employer] in [place]    Enjoys drag racing      Constitutional: Denies fever, malaise, fatigue, headache or abrupt weight changes.  Musculoskeletal: Pt reports swelling of left elbow. Denies decrease in range of motion, difficulty with gait, muscle pain.    No other specific complaints in a complete review of systems (except as listed in HPI  above).     Objective:   Physical Exam   BP 104/60 (BP Location: Left Arm, Patient Position: Sitting, Cuff Size: Large)   Pulse 96   Temp 97.6 F (36.4 C) (Oral)   Wt 283 lb (128.4 kg)   SpO2 97%   BMI 35.37 kg/m  Wt Readings from Last 3 Encounters:  04/28/16 283 lb (128.4 kg)  05/12/15 259 lb (117.5 kg)  04/08/15 261 lb (118.4 kg)    General: Appears his stated age, obese in NAD. Skin: Warm, dry and intact. No redness or warmth noted of left elbow. Musculoskeletal: Normal flexion, extension and rotation of left elbow. Olecranon bursitis noted of left elbow. BMET    Component Value Date/Time   NA 137 02/26/2014 1020   NA 139 09/15/2012 1950   K 4.7 02/26/2014 1020   K 3.8 09/15/2012 1950   CL 104 02/26/2014 1020   CL 106 09/15/2012 1950   CO2 29 02/26/2014 1020   CO2 27 09/15/2012 1950   GLUCOSE 90 02/26/2014 1020   GLUCOSE 140 (H) 09/15/2012 1950   BUN 23 02/26/2014 1020   BUN 14 09/15/2012 1950   CREATININE 0.94 02/26/2014 1020   CREATININE 0.88 09/15/2012  1950   CALCIUM 10.3 02/26/2014 1020   CALCIUM 8.8 09/15/2012 1950   GFRNONAA >60 09/15/2012 1950   GFRAA >60 09/15/2012 1950    Lipid Panel     Component Value Date/Time   CHOL 139 02/26/2014 1020   TRIG 147.0 02/26/2014 1020   HDL 32.40 (L) 02/26/2014 1020   CHOLHDL 4 02/26/2014 1020   VLDL 29.4 02/26/2014 1020   LDLCALC 77 02/26/2014 1020    CBC    Component Value Date/Time   WBC 8.5 07/08/2014 1104   WBC 10.8 (H) 02/26/2014 1020   RBC 5.22 07/08/2014 1104   RBC 5.64 02/26/2014 1020   HGB 16.5 02/26/2014 1020   HGB 15.4 09/15/2012 1950   HCT 46.1 07/08/2014 1104   PLT 199 07/08/2014 1104   MCV 88 07/08/2014 1104   MCV 89 09/15/2012 1950   MCH 29.7 07/08/2014 1104   MCH 30.5 09/15/2012 1950   MCHC 33.6 07/08/2014 1104   MCHC 34.3 02/26/2014 1020   RDW 15.1 07/08/2014 1104   RDW 14.3 09/15/2012 1950   LYMPHSABS 2.5 07/08/2014 1104   MONOABS 0.6 02/26/2014 1020   EOSABS 0.2 07/08/2014  1104   BASOSABS 0.0 07/08/2014 1104    Hgb A1C Lab Results  Component Value Date   HGBA1C 5.9 02/26/2014           Assessment & Plan:   Olecranon Bursitis:  Discussed use of ace wrap for compression eRx for Pred Taper x 9 days If no improvement, make app with Dr. Patsy Lager for removal of fluid/steroid injection  RTC as needed or if symptoms persist or worsen Collis Thede, NP

## 2016-04-28 NOTE — Progress Notes (Signed)
Pre visit review using our clinic review tool, if applicable. No additional management support is needed unless otherwise documented below in the visit note. 

## 2016-04-28 NOTE — Telephone Encounter (Signed)
Pt called and stated that for the last 3 months he has fluid on his left elbow and is stating that now it is going up his arm. Sent call to [facility] Triage.  Call pt @ [phone]

## 2016-05-08 ENCOUNTER — Encounter: Payer: Self-pay | Admitting: Emergency Medicine

## 2016-05-08 ENCOUNTER — Emergency Department
Admission: EM | Admit: 2016-05-08 | Discharge: 2016-05-08 | Disposition: A | Discharge status: Home / Self Care | Payer: 59 | Attending: Emergency Medicine | Admitting: Emergency Medicine

## 2016-05-08 DIAGNOSIS — F17210 Nicotine dependence, cigarettes, uncomplicated: Secondary | ICD-10-CM | POA: Diagnosis not present

## 2016-05-08 DIAGNOSIS — L02416 Cutaneous abscess of left lower limb: Secondary | ICD-10-CM | POA: Insufficient documentation

## 2016-05-08 DIAGNOSIS — L0291 Cutaneous abscess, unspecified: Secondary | ICD-10-CM

## 2016-05-08 MED ORDER — HYDROMORPHONE HCL 1 MG/ML IJ SOLN
1.0000 mg | Freq: Once | INTRAMUSCULAR | Status: AC
  Administered 2016-05-08: 1 mg via INTRAMUSCULAR
  Filled 2016-05-08: qty 1

## 2016-05-08 MED ORDER — LIDOCAINE HCL (PF) 1 % IJ SOLN
INTRAMUSCULAR | Status: AC
  Filled 2016-05-08: qty 10

## 2016-05-08 MED ORDER — OXYCODONE-ACETAMINOPHEN 7.5-325 MG PO TABS: 1.0000 | ORAL_TABLET | Freq: Four times a day (QID) | ORAL | 0 refills | Status: DC | PRN

## 2016-05-08 MED ORDER — SULFAMETHOXAZOLE-TRIMETHOPRIM 800-160 MG PO TABS: 1.0000 | ORAL_TABLET | Freq: Two times a day (BID) | ORAL | 0 refills | Status: DC

## 2016-05-08 NOTE — ED Notes (Signed)
See triage note  States he noticed a large red raised area to inside left thigh    States area has been there for 3 days   Worse today

## 2016-05-08 NOTE — ED Triage Notes (Signed)
Pt with abscess to left inner thigh x3 days. Pt ambulatory, reports painful to walk.

## 2016-05-08 NOTE — ED Provider Notes (Signed)
[facility] Emergency Department Provider Note   ____________________________________________   None    (approximate)  I have reviewed the triage vital signs and the nursing notes.   HISTORY  Chief Complaint Abscess    HPI Albert Houston is a 41 y.o. male patient complaining of draining abscess to the left inner thigh for 3 days. Patient stated pain only with ambulation. 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 Patient noticed lesion 3 days ago had his wife tried to express pus from the lesion. Patient state that he worsens his complaint. Currently rates his pain as a 9/10. Patient described a pain as "achy".   Past Medical History:  Diagnosis Date  . Flu 03-17-15   RESOLVED NOW (03-26-15)  . Headache    h/o migraine    Patient Active Problem List   Diagnosis Date Noted  . Puncture wound of ankle with complication 06/13/2014  . Ingrown toenail 06/13/2014  . Left ankle injury 06/07/2014  . Encounter to establish care 03/01/2014  . Tobacco use disorder 03/01/2014  . Abscess of skin of abdomen 03/01/2014  . Low back pain 03/01/2014    Past Surgical History:  Procedure Laterality Date  . INGUINAL HERNIA REPAIR Left 04/01/2015   Procedure: LAPAROSCOPIC INGUINAL HERNIA;  Surgeon: Kieth Brightly, MD;  Location: [facility] ORS;  Service: General;  Laterality: Left;  Marland Kitchen MANDIBLE FRACTURE SURGERY  2005  . TYMPANOSTOMY TUBE PLACEMENT     As a Child  . UMBILICAL HERNIA REPAIR N/A 04/01/2015   Procedure: laproscopic umbilical hernia repair;  Surgeon: Kieth Brightly, MD;  Location: [facility] ORS;  Service: General;  Laterality: N/A;    Prior to Admission medications   Medication Sig Start Date End Date Taking? Authorizing Provider  oxyCODONE-acetaminophen (PERCOCET) 7.5-325 MG tablet Take 1 tablet by mouth every 6 (six) hours as needed for severe pain. 05/08/16   Joni Reining, PA-C    predniSONE (DELTASONE) 10 MG tablet Take 3 tabs on days 1-3, take 2 tabs on days 4-6, take 1 tab on days 7-9 04/28/16   Lorre Munroe, NP  sulfamethoxazole-trimethoprim (BACTRIM DS,SEPTRA DS) 800-160 MG tablet Take 1 tablet by mouth 2 (two) times daily. 05/08/16   Joni Reining, PA-C    Allergies Patient has no known allergies.  Family History  Problem Relation Age of Onset  . Arthritis Maternal Grandmother   . Hypertension Maternal Grandmother   . Diabetes Paternal Grandfather     Social History Social History  Substance Use Topics  . Smoking status: Current Some Day Smoker    Packs/day: 1.50    Years: 12.00    Types: Cigarettes  . Smokeless tobacco: Never Used     Comment: using patch currently but if he forgets patch he will smoke   . Alcohol use 0.6 oz/week    1 Cans of beer per week     Comment: rare    Review of Systems  Constitutional: No fever/chills Eyes: No visual changes. ENT: No sore throat. Cardiovascular: Denies chest pain. Respiratory: Denies shortness of breath. Gastrointestinal: No abdominal pain.  No nausea, no vomiting.  No diarrhea.  No constipation. Genitourinary: Negative for dysuria. Musculoskeletal: Negative for back pain. Skin: Negative for rash. Drainage from a nausea lesion left medial thigh. Neurological: Negative for headaches, focal weakness or numbness.   ____________________________________________   PHYSICAL EXAM:  VITAL SIGNS: ED Triage Vitals  Enc Vitals Group  BP 05/08/16 1131 (!) 149/85     Pulse Rate 05/08/16 1131 (!) 102     Resp 05/08/16 1131 18     Temp 05/08/16 1131 98.2 F (36.8 C)     Temp Source 05/08/16 1131 Oral     SpO2 05/08/16 1131 98 %     Weight 05/08/16 1125 279 lb (126.6 kg)     Height 05/08/16 1125  (1.905 m)     Head Circumference --      Peak Flow --      Pain Score 05/08/16 1124 9     Pain Loc --      Pain Edu? --      Excl. in GC? --     Constitutional: Alert and oriented. Well  appearing and in no acute distress. Eyes: Conjunctivae are normal. PERRL. EOMI. Head: Atraumatic. Nose: No congestion/rhinnorhea. Mouth/Throat: Mucous membranes are moist.  Oropharynx non-erythematous. Neck: No stridor.  No cervical spine tenderness to palpation. Hematological/Lymphatic/Immunilogical: No cervical lymphadenopathy. Cardiovascular: Normal rate, regular rhythm. Grossly normal heart sounds.  Good peripheral circulation. Respiratory: Normal respiratory effort.  No retractions. Lungs CTAB. Gastrointestinal: Soft and nontender. No distention. No abdominal bruits. No CVA tenderness. Musculoskeletal: No lower extremity tenderness nor edema.  No joint effusions. Neurologic:  Normal speech and language. No gross focal neurologic deficits are appreciated. No gait instability. Skin:  Skin is warm, dry and intact. No rash noted. Psychiatric: Mood and affect are normal. Speech and behavior are normal.  ____________________________________________   LABS (all labs ordered are listed, but only abnormal results are displayed)  Labs Reviewed - No data to display ____________________________________________  EKG   ____________________________________________  RADIOLOGY   ____________________________________________   PROCEDURES  Procedure(s) performed: INCISION AND DRAINAGE Performed by: Nona Dell PA-C Consent: Verbal consent obtained. Risks and benefits: risks, benefits and alternatives were discussed Type: abscess  Body area: Left medial thigh  Anesthesia: local infiltration  Incision was made with a scalpel.  Local anesthetic: lidocaine 1% without epinephrine  Anesthetic total: 8 ml  Complexity: complex Blunt dissection to break up loculations  Drainage: purulent  Drainage amount: Moderate   Packing material: 1/4 in iodoform gauze  Patient tolerance: Patient tolerated the procedure well with no immediate complications.     Procedures  Critical  Care performed: No  ____________________________________________   INITIAL IMPRESSION / ASSESSMENT AND PLAN / ED COURSE  Pertinent labs & imaging results that were available during my care of the patient were reviewed by me and considered in my medical decision making (see chart for details).  Abscess left medial thigh. Patient given discharge care instructions. Patient get a prescription for Bactrim DS and Percocets. Patient advised return back in 2 days for wound recheck.      ____________________________________________   FINAL CLINICAL IMPRESSION(S) / ED DIAGNOSES  Final diagnoses:  Abscess      NEW MEDICATIONS STARTED DURING THIS VISIT:  Discharge Medication List as of 05/08/2016 12:44 PM    START taking these medications   Details  oxyCODONE-acetaminophen (PERCOCET) 7.5-325 MG tablet Take 1 tablet by mouth every 6 (six) hours as needed for severe pain., Starting Mon 05/08/2016, Print    sulfamethoxazole-trimethoprim (BACTRIM DS,SEPTRA DS) 800-160 MG tablet Take 1 tablet by mouth 2 (two) times daily., Starting Mon 05/08/2016, Print         Note:  This document was prepared using Dragon voice recognition software and may include unintentional dictation errors.    Joni Reining, PA-C 05/08/16 [phone]  Jene Every, MD 05/09/16 1040

## 2016-05-10 ENCOUNTER — Encounter: Payer: Self-pay | Admitting: Emergency Medicine

## 2016-05-10 ENCOUNTER — Emergency Department
Admission: EM | Admit: 2016-05-10 | Discharge: 2016-05-10 | Disposition: A | Discharge status: Home / Self Care | Payer: 59 | Attending: Emergency Medicine | Admitting: Emergency Medicine

## 2016-05-10 DIAGNOSIS — Z5189 Encounter for other specified aftercare: Secondary | ICD-10-CM

## 2016-05-10 DIAGNOSIS — Z4801 Encounter for change or removal of surgical wound dressing: Secondary | ICD-10-CM | POA: Diagnosis present

## 2016-05-10 DIAGNOSIS — F17200 Nicotine dependence, unspecified, uncomplicated: Secondary | ICD-10-CM | POA: Diagnosis not present

## 2016-05-10 DIAGNOSIS — Z79899 Other long term (current) drug therapy: Secondary | ICD-10-CM | POA: Diagnosis not present

## 2016-05-10 MED ORDER — OXYCODONE-ACETAMINOPHEN 7.5-325 MG PO TABS: 1.0000 | ORAL_TABLET | Freq: Four times a day (QID) | ORAL | 0 refills | Status: DC | PRN

## 2016-05-10 MED ORDER — ONDANSETRON HCL 8 MG PO TABS: 8.0000 mg | ORAL_TABLET | Freq: Three times a day (TID) | ORAL | 0 refills | Status: DC | PRN

## 2016-05-10 NOTE — ED Notes (Signed)
See triage note  States he is here for wound check  Had abscess to left inner thigh lanced 2 days ago  conts to have pain

## 2016-05-10 NOTE — ED Provider Notes (Signed)
[facility] Emergency Department Provider Note   ____________________________________________   First MD Initiated Contact with Patient 05/10/16 1222     (approximate)  I have reviewed the triage vital signs and the nursing notes.   HISTORY  Chief Complaint Wound Check    HPI Albert Houston is a 41 y.o. male patient presents for wound check recheck secondary to an incision and drainage of abscess medial left thigh 2 days ago. Patient stated except for nausea when taking antibiotics and pain medication he is doing well. Patient's states decreased discharge from  site.Patient states pain has decreased from a 10 over 10 to a 4/10. Patient described a pain as "achy". Patient denies any fever status post departure 2 days ago.   Past Medical History:  Diagnosis Date  . Flu 03-17-15   RESOLVED NOW (03-26-15)  . Headache    h/o migraine    Patient Active Problem List   Diagnosis Date Noted  . Puncture wound of ankle with complication 06/13/2014  . Ingrown toenail 06/13/2014  . Left ankle injury 06/07/2014  . Encounter to establish care 03/01/2014  . Tobacco use disorder 03/01/2014  . Abscess of skin of abdomen 03/01/2014  . Low back pain 03/01/2014    Past Surgical History:  Procedure Laterality Date  . INGUINAL HERNIA REPAIR Left 04/01/2015   Procedure: LAPAROSCOPIC INGUINAL HERNIA;  Surgeon: Kieth Brightly, MD;  Location: [facility] ORS;  Service: General;  Laterality: Left;  Marland Kitchen MANDIBLE FRACTURE SURGERY  2005  . TYMPANOSTOMY TUBE PLACEMENT     As a Child  . UMBILICAL HERNIA REPAIR N/A 04/01/2015   Procedure: laproscopic umbilical hernia repair;  Surgeon: Kieth Brightly, MD;  Location: [facility] ORS;  Service: General;  Laterality: N/A;    Prior to Admission medications   Medication Sig Start Date End Date Taking? Authorizing Provider  ondansetron (ZOFRAN) 8 MG tablet Take 1 tablet (8 mg total) by mouth every 8 (eight) hours as needed for nausea  or vomiting. 05/10/16   Joni Reining, PA-C  oxyCODONE-acetaminophen (PERCOCET) 7.5-325 MG tablet Take 1 tablet by mouth every 6 (six) hours as needed for severe pain. 05/08/16   Joni Reining, PA-C  oxyCODONE-acetaminophen (PERCOCET) 7.5-325 MG tablet Take 1 tablet by mouth every 6 (six) hours as needed for severe pain. 05/10/16   Joni Reining, PA-C  predniSONE (DELTASONE) 10 MG tablet Take 3 tabs on days 1-3, take 2 tabs on days 4-6, take 1 tab on days 7-9 04/28/16   Lorre Munroe, NP  sulfamethoxazole-trimethoprim (BACTRIM DS,SEPTRA DS) 800-160 MG tablet Take 1 tablet by mouth 2 (two) times daily. 05/08/16   Joni Reining, PA-C    Allergies Patient has no known allergies.  Family History  Problem Relation Age of Onset  . Arthritis Maternal Grandmother   . Hypertension Maternal Grandmother   . Diabetes Paternal Grandfather     Social History Social History  Substance Use Topics  . Smoking status: Current Some Day Smoker    Packs/day: 1.50    Years: 12.00    Types: Cigarettes  . Smokeless tobacco: Never Used     Comment: using patch currently but if he forgets patch he will smoke   . Alcohol use 0.6 oz/week    1 Cans of beer per week     Comment: rare    Review of Systems  Constitutional: No fever/chills Eyes: No visual changes. ENT: No sore throat. Cardiovascular: Denies chest pain. Respiratory: Denies shortness of  breath. Gastrointestinal: No abdominal pain.  No nausea, no vomiting.  No diarrhea.  No constipation. Genitourinary: Negative for dysuria. Musculoskeletal: Negative for back pain. Skin: Negative for rash. Abscess Neurological: Negative for headaches, focal weakness or numbness. ____________________________________________   PHYSICAL EXAM:  VITAL SIGNS: ED Triage Vitals  Enc Vitals Group     BP 05/10/16 1159 (!) 143/95     Pulse Rate 05/10/16 1159 97     Resp 05/10/16 1159 16     Temp --      Temp Source 05/10/16 1159 Oral     SpO2 05/10/16 1159 96 %      Weight 05/10/16 1159 279 lb (126.6 kg)     Height 05/10/16 1159  (1.905 m)     Head Circumference --      Peak Flow --      Pain Score 05/10/16 1158 4     Pain Loc --      Pain Edu? --      Excl. in GC? --     Constitutional: Alert and oriented. Well appearing and in no acute distress. Eyes: Conjunctivae are normal. PERRL. EOMI. Head: Atraumatic. Nose: No congestion/rhinnorhea. Mouth/Throat: Mucous membranes are moist.  Oropharynx non-erythematous. Neck: No stridor.  No cervical spine tenderness to palpation. Hematological/Lymphatic/Immunilogical: No cervical lymphadenopathy. Cardiovascular: Normal rate, regular rhythm. Grossly normal heart sounds.  Good peripheral circulation. Respiratory: Normal respiratory effort.  No retractions. Lungs CTAB. Gastrointestinal: Soft and nontender. No distention. No abdominal bruits. No CVA tenderness. Musculoskeletal: No lower extremity tenderness nor edema.  No joint effusions. Neurologic:  Normal speech and language. No gross focal neurologic deficits are appreciated. No gait instability. Skin:  Skin is warm, dry and intact. No rash noted. Decreasing edema and erythema around abscess to the left medial thigh. Psychiatric: Mood and affect are normal. Speech and behavior are normal.  ____________________________________________   LABS (all labs ordered are listed, but only abnormal results are displayed)  Labs Reviewed - No data to display ____________________________________________  EKG   ____________________________________________  RADIOLOGY   ____________________________________________   PROCEDURES  Procedure(s) performed: None  Procedures  Critical Care performed: No  ____________________________________________   INITIAL IMPRESSION / ASSESSMENT AND PLAN / ED COURSE  Pertinent labs & imaging results that were available during my care of the patient were reviewed by me and considered in my medical decision  making (see chart for details).  Healing abscess left medial thigh. Iodoform was removed and wound irrigated with clear return. Patient was re-bandage and given discharge care instructions. Patient given a work note.      ____________________________________________   FINAL CLINICAL IMPRESSION(S) / ED DIAGNOSES  Final diagnoses:  Abscess re-check      NEW MEDICATIONS STARTED DURING THIS VISIT:  New Prescriptions   ONDANSETRON (ZOFRAN) 8 MG TABLET    Take 1 tablet (8 mg total) by mouth every 8 (eight) hours as needed for nausea or vomiting.   OXYCODONE-ACETAMINOPHEN (PERCOCET) 7.5-325 MG TABLET    Take 1 tablet by mouth every 6 (six) hours as needed for severe pain.     Note:  This document was prepared using Dragon voice recognition software and may include unintentional dictation errors.    Joni Reining, PA-C 05/10/16 1230    Governor Rooks, MD 05/10/16 [phone]

## 2016-05-10 NOTE — ED Triage Notes (Signed)
Patient had an abscess lanced and packed in the ED recently and is back to have it checked.  Patient is in no obvious distress at this time.

## 2016-05-24 ENCOUNTER — Encounter: Payer: Self-pay | Admitting: Sports Medicine

## 2016-05-24 ENCOUNTER — Ambulatory Visit (INDEPENDENT_AMBULATORY_CARE_PROVIDER_SITE_OTHER): Payer: 59 | Admitting: Sports Medicine

## 2016-05-24 ENCOUNTER — Ambulatory Visit (INDEPENDENT_AMBULATORY_CARE_PROVIDER_SITE_OTHER): Payer: 59 | Admitting: General Surgery

## 2016-05-24 ENCOUNTER — Ambulatory Visit: Payer: Self-pay

## 2016-05-24 ENCOUNTER — Encounter: Payer: Self-pay | Admitting: General Surgery

## 2016-05-24 VITALS — BP 132/74 | HR 94 | Ht 75.5 in | Wt 281.6 lb

## 2016-05-24 VITALS — BP 142/82 | HR 80 | Resp 14 | Ht 75.0 in | Wt 280.0 lb

## 2016-05-24 DIAGNOSIS — L02211 Cutaneous abscess of abdominal wall: Secondary | ICD-10-CM | POA: Diagnosis not present

## 2016-05-24 DIAGNOSIS — L02224 Furuncle of groin: Secondary | ICD-10-CM | POA: Diagnosis not present

## 2016-05-24 DIAGNOSIS — M25422 Effusion, left elbow: Secondary | ICD-10-CM

## 2016-05-24 DIAGNOSIS — M7022 Olecranon bursitis, left elbow: Secondary | ICD-10-CM | POA: Diagnosis not present

## 2016-05-24 DIAGNOSIS — M25522 Pain in left elbow: Secondary | ICD-10-CM | POA: Diagnosis not present

## 2016-05-24 DIAGNOSIS — F17200 Nicotine dependence, unspecified, uncomplicated: Secondary | ICD-10-CM | POA: Diagnosis not present

## 2016-05-24 DIAGNOSIS — L732 Hidradenitis suppurativa: Secondary | ICD-10-CM

## 2016-05-24 MED ORDER — DOXYCYCLINE HYCLATE 100 MG PO CAPS: 100.0000 mg | ORAL_CAPSULE | Freq: Once | ORAL | 0 refills | Status: AC

## 2016-05-24 NOTE — Assessment & Plan Note (Signed)
Large amount of bursal fluid drained today without difficulty.  Normal-appearing fluid that was only slightly blood-tinged. Compression wrap today provided and recommend continuous compression at home with full elbow sleeve from [employer].  Information for this was provided today.  Importance of compression was emphasized.  PROCEDURE NOTE -  ULTRASOUND GUIDEDINJECTION: Left olecranon bursal aspiration and injection Images were obtained and interpreted by myself, Gaspar BiddingMichael Aubree Doody, DO  Images have been saved and stored to PACS system. Images obtained on: GE S7 Ultrasound machine  ULTRASOUND FINDINGS: Large amount of bursal fluid.  DESCRIPTION OF PROCEDURE:  The patient's clinical condition is marked by substantial pain and/or significant functional disability. Other conservative therapy has not provided relief, is contraindicated, or not appropriate. There is a reasonable likelihood that injection will significantly improve the patient's pain and/or functional impairment. After discussing the risks, benefits and expected outcomes of the injection and all questions were reviewed and answered, the patient wished to undergo the above named procedure. Verbal consent was obtained. The ultrasound was used to identify the target structure and adjacent neurovascular structures. The skin was then prepped in sterile fashion and the target structure was injected under direct visualization using sterile technique as below: PREP: Alcohol, Ethel Chloride, 5cc 1% lidocaine on 25 needle APPROACH: Posterior, sterile stopcock Technique, 18g 1.5" needle INJECTATE: 2 cc 0.5% marcaine, 2cc 40mg  DepoMedrol ASPIRATE: 25 cc of normal appearing bursal fluid aspirated without difficulty. DRESSING: Band-Aid and 4 inch Ace wrap  Post procedural instructions including recommending icing and warning signs for infection were reviewed. This procedure was well tolerated and there were no complications.   IMPRESSION: Succesful  US Guided Aspiration & injection

## 2016-05-24 NOTE — Patient Instructions (Signed)
I recommend you obtained a compression sleeve to help with your joint problems. There are many options on the market however I recommend obtaining a FULL ELBOW [employer]ix compression sleeve.  You can find information (including how to appropriate measure yourself for sizing) can be found at [email or web address].  Many of these products are health savings account (HSA) eligible.   You can use the compression sleeve at any time throughout the day but is most important to use while being active as well as for 2 hours post-activity.   It is appropriate to ice following activity with the compression sleeve in place.

## 2016-05-24 NOTE — Assessment & Plan Note (Signed)
Increased risk of recurrent skin infections with tobacco abuse. Recommend smoking cessation.

## 2016-05-24 NOTE — Assessment & Plan Note (Signed)
Patient has recently been seen today by plastic surgeon for excision of sweat glands.  He has been started on doxycycline 100 mg twice daily to be taken daily for the next 6 weeks.  This should help reduce the risk of any type of secondary bursal infection. We did review the details and concerns of infection signs and symptoms of a septic bursa and he voices understanding.

## 2016-05-24 NOTE — Progress Notes (Signed)
OFFICE VISIT NOTE Albert Houston. [name] [phone]  ULUS HAZEN - 41 y.o. male MRN [id number]  Date of birth: 1975-07-21  Visit Date: 05/24/2016  PCP: Patient, No Pcp Per   Referred by: No ref. provider found  [name], CMA acting as scribe for Dr. Berline Chough.  SUBJECTIVE:   Chief Complaint  Patient presents with  . pain and swelling in left elbow   HPI: As below and per problem based documentation when appropriate.  Pt presents today with pain and swelling in left elbow. He felt a sharp pain when he rested his arm on an arm rest. Pain started a few months ago but has gotten worse over the oast few weeks No known injury or trauma  The pain is described as soreness, aching and is rated as 6/10.  Pt says that they swelling got worse after taking prednisone Nothing seems to alleviate the pain Therapies tried include Ibuprofen, little relief.   Other associated symptoms include: Pain radiates up into shoulder    Review of Systems  Constitutional: Negative for chills and fever.  Respiratory: Positive for shortness of breath.   Cardiovascular: Negative for chest pain.  Gastrointestinal: Negative for constipation and diarrhea.  Musculoskeletal: Positive for joint pain and myalgias.  Neurological: Negative for dizziness, tingling and headaches.    Otherwise per HPI.  HISTORY & PERTINENT PRIOR DATA:  No specialty comments available. He reports that he has been smoking Cigarettes.  He has a 18.00 pack-year smoking history. He has never used smokeless tobacco. No results for input(s): HGBA1C, LABURIC in the last 8760 hours. Medications & Allergies reviewed per EMR Patient Active Problem List   Diagnosis Date Noted  . Olecranon bursitis of left elbow 05/24/2016  . Puncture wound of ankle with complication 06/13/2014  . Ingrown toenail 06/13/2014  . Left ankle injury 06/07/2014  . Encounter to establish care  03/01/2014  . Tobacco use disorder 03/01/2014  . Abscess of skin of abdomen 03/01/2014  . Low back pain 03/01/2014   Past Medical History:  Diagnosis Date  . Flu 03-17-15   RESOLVED NOW (03-26-15)  . Headache    h/o migraine   Family History  Problem Relation Age of Onset  . Arthritis Maternal Grandmother   . Hypertension Maternal Grandmother   . Diabetes Paternal Grandfather    Past Surgical History:  Procedure Laterality Date  . INGUINAL HERNIA REPAIR Left 04/01/2015   Procedure: LAPAROSCOPIC INGUINAL HERNIA;  Surgeon: Kieth Brightly, MD;  Location: [facility] ORS;  Service: General;  Laterality: Left;  Marland Kitchen MANDIBLE FRACTURE SURGERY  2005  . TYMPANOSTOMY TUBE PLACEMENT     As a Child  . UMBILICAL HERNIA REPAIR N/A 04/01/2015   Procedure: laproscopic umbilical hernia repair;  Surgeon: Kieth Brightly, MD;  Location: [facility] ORS;  Service: General;  Laterality: N/A;   Social History   Occupational History  . Not on file.   Social History Main Topics  . Smoking status: Current Some Day Smoker    Packs/day: 1.50    Years: 12.00    Types: Cigarettes  . Smokeless tobacco: Never Used     Comment: using patch currently but if he forgets patch he will smoke   . Alcohol use 0.6 oz/week    1 Cans of beer per week     Comment: rare  . Drug use: No  . Sexual activity: Not on file    OBJECTIVE:  VS:  HT:6' 3.5" (191.8 cm)   WT:281 lb 9.6 oz (127.7 kg)  BMI:34.8    BP:132/74  HR:94bpm  TEMP: ( )  RESP:96 % EXAM: Findings:  WDWN, NAD, Non-toxic appearing Alert & appropriately interactive Not depressed or anxious appearing No increased work of breathing. Pupils are equal. EOM intact without nystagmus No clubbing or cyanosis of the extremities appreciated No significant rashes/lesions/ulcerations overlying the examined area. Radial pulses 2+/4.  No significant generalized UE edema. Sensation intact to light touch in upper extremities.    Left elbow: Very large  olecranon bursa.  No surrounding erythema.  Full elbow range of motion. Slightly positive Tinel's  Over the cubital tunnel..     No results found. ASSESSMENT & PLAN:   Problem List Items Addressed This Visit    Tobacco use disorder    Increased risk of recurrent skin infections with tobacco abuse. Recommend smoking cessation.      Abscess of skin of abdomen    Patient has recently been seen today by plastic surgeon for excision of sweat glands.  He has been started on doxycycline 100 mg twice daily to be taken daily for the next 6 weeks.  This should help reduce the risk of any type of secondary bursal infection. We did review the details and concerns of infection signs and symptoms of a septic bursa and he voices understanding.      Olecranon bursitis of left elbow    Large amount of bursal fluid drained today without difficulty.  Normal-appearing fluid that was only slightly blood-tinged. Compression wrap today provided and recommend continuous compression at home with full elbow sleeve from [employer].  Information for this was provided today.  Importance of compression was emphasized.  PROCEDURE NOTE -  ULTRASOUND GUIDEDINJECTION: Left olecranon bursal aspiration and injection Images were obtained and interpreted by myself, [name], DO  Images have been saved and stored to PACS system. Images obtained on: GE S7 Ultrasound machine  ULTRASOUND FINDINGS: Large amount of bursal fluid.  DESCRIPTION OF PROCEDURE:  The patient's clinical condition is marked by substantial pain and/or significant functional disability. Other conservative therapy has not provided relief, is contraindicated, or not appropriate. There is a reasonable likelihood that injection will significantly improve the patient's pain and/or functional impairment. After discussing the risks, benefits and expected outcomes of the injection and all questions were reviewed and answered, the patient wished to undergo  the above named procedure. Verbal consent was obtained. The ultrasound was used to identify the target structure and adjacent neurovascular structures. The skin was then prepped in sterile fashion and the target structure was injected under direct visualization using sterile technique as below: PREP: Alcohol, Ethel Chloride, 5cc 1% lidocaine on 25 needle APPROACH: Posterior, sterile stopcock Technique, 18g 1.5" needle INJECTATE: 2 cc 0.5% marcaine, 2cc 40mg  DepoMedrol ASPIRATE: 25 cc of normal appearing bursal fluid aspirated without difficulty. DRESSING: Band-Aid and 4 inch Ace wrap  Post procedural instructions including recommending icing and warning signs for infection were reviewed. This procedure was well tolerated and there were no complications.   IMPRESSION: Succesful US Guided Aspiration & injection           Other Visit Diagnoses    Pain and swelling of left elbow    -  Primary   Relevant Orders   [place] LIMITED JOINT SPACE STRUCTURES UP LEFT(NO LINKED CHARGES)      Follow-up: Return in about 4 weeks (around 06/21/2016).   CMA/ATC served as Neurosurgeon during this visit. History, Physical,  and Plan performed by medical provider. Documentation and orders reviewed and attested to.      Gaspar BiddingMichael Rigby, DO    [facility] Physician    05/24/2016 2:54 PM

## 2016-05-24 NOTE — Progress Notes (Signed)
Patient ID: Albert Houston, male   DOB: 04-25-1975, 41 y.o.   MRN: [id number]  Chief Complaint  Patient presents with  . Inguinal Hernia    HPI Albert Houston is a 41 y.o. male is here today for an evaluation for boils that have developed on the inside of his thighs. The patient had a left inguinal hernia repair done 03/17/15.  The patient went to the ER on 05/10/16 for the glands. He states  Left thigh abscesswas lanced and he was out of work for a week.  He states it is no longer draining. He now has a similar abscess on inner right thigh. He has been dealing with this for a long time.   HPI  Past Medical History:  Diagnosis Date  . Flu 03-17-15   RESOLVED NOW (03-26-15)  . Headache    h/o migraine    Past Surgical History:  Procedure Laterality Date  . INGUINAL HERNIA REPAIR Left 04/01/2015   Procedure: LAPAROSCOPIC INGUINAL HERNIA;  Surgeon: Kieth Brightly, MD;  Location: [facility] ORS;  Service: General;  Laterality: Left;  Marland Kitchen MANDIBLE FRACTURE SURGERY  2005  . TYMPANOSTOMY TUBE PLACEMENT     As a Child  . UMBILICAL HERNIA REPAIR N/A 04/01/2015   Procedure: laproscopic umbilical hernia repair;  Surgeon: Kieth Brightly, MD;  Location: [facility] ORS;  Service: General;  Laterality: N/A;    Family History  Problem Relation Age of Onset  . Arthritis Maternal Grandmother   . Hypertension Maternal Grandmother   . Diabetes Paternal Grandfather     Social History Social History  Substance Use Topics  . Smoking status: Current Some Day Smoker    Packs/day: 1.50    Years: 12.00    Types: Cigarettes  . Smokeless tobacco: Never Used     Comment: using patch currently but if he forgets patch he will smoke   . Alcohol use 0.6 oz/week    1 Cans of beer per week     Comment: rare    No Known Allergies  No current outpatient prescriptions on file.   No current facility-administered medications for this visit.     Review of Systems Review of Systems  Constitutional:  Negative.   Respiratory: Negative.   Cardiovascular: Negative.     Blood pressure (!) 142/82, pulse 80, resp. rate 14, height 6\' 3"  (1.905 m), weight 280 lb (127 kg).  Physical Exam Physical Exam  Constitutional: He is oriented to person, place, and time. He appears well-developed and well-nourished.  Eyes: No scleral icterus.  Lymphadenopathy:       Right: No inguinal adenopathy present.       Left: No inguinal adenopathy present.  Neurological: He is alert and oriented to person, place, and time.  Skin: Skin is warm and dry.       Data Reviewed Prior notes and ER Notes  Assessment    Hidradenitis with an active abscess in the right inner thigh at present    Plan   with patient consent the abscess was drained. 1 mL 1% Xylocaine mixed with 0.5% Marcaine was instilled after Betadine prep. A cruciate incision was made and about 3-4 mL of thick, yellowish pus drained out. This was cultured. Dressed with dry gauze. Take doxycyline 100 mg daily, follow up in 2 weeks.  HPI, Physical Exam, Assessment and Plan have been scribed under the direction and in the presence of Kathreen Cosier, MD.  Milas Kocher, CMA  I have completed the exam and  reviewed the above documentation for accuracy and completeness.  I agree with the above.  [employer]gallery [employer] has been used and any errors in dictation or transcription are unintentional.  Ieisha Gao G. Evette CristalSankar, M.D., F.A.C.S.         Gerlene BurdockSANKAR,Otis Portal G 05/26/2016, 9:03 AM

## 2016-05-24 NOTE — Patient Instructions (Signed)
Follow up in 2 weeks. Take doxycyline daily.

## 2016-05-29 LAB — ANAEROBIC AND AEROBIC CULTURE

## 2016-06-08 ENCOUNTER — Ambulatory Visit (INDEPENDENT_AMBULATORY_CARE_PROVIDER_SITE_OTHER): Payer: 59 | Admitting: General Surgery

## 2016-06-08 ENCOUNTER — Encounter: Payer: Self-pay | Admitting: General Surgery

## 2016-06-08 VITALS — BP 124/78 | HR 84 | Resp 12 | Ht 75.0 in | Wt 276.0 lb

## 2016-06-08 DIAGNOSIS — L732 Hidradenitis suppurativa: Secondary | ICD-10-CM

## 2016-06-08 MED ORDER — DOXYCYCLINE HYCLATE 50 MG PO TBEC: 50.0000 mg | DELAYED_RELEASE_TABLET | Freq: Every day | ORAL | 0 refills | Status: DC

## 2016-06-08 NOTE — Progress Notes (Signed)
Patient ID: Albert Houston, male   DOB: 1975-06-20, 41 y.o.   MRN: [id number]  Chief Complaint  Patient presents with  . Follow-up    HPI Albert Houston is a 41 y.o. male here today for his follow up hidradenitis. He states the area is still very tender.  Completed course of doxy, reports improvement in pain.  HPI  Past Medical History:  Diagnosis Date  . Flu 03-17-15   RESOLVED NOW (03-26-15)  . Headache    h/o migraine    Past Surgical History:  Procedure Laterality Date  . INGUINAL HERNIA REPAIR Left 04/01/2015   Procedure: LAPAROSCOPIC INGUINAL HERNIA;  Surgeon: Kieth BrightlySeeplaputhur G Azarias Chiou, MD;  Location: [facility] ORS;  Service: General;  Laterality: Left;  Marland Kitchen. MANDIBLE FRACTURE SURGERY  2005  . TYMPANOSTOMY TUBE PLACEMENT     As a Child  . UMBILICAL HERNIA REPAIR N/A 04/01/2015   Procedure: laproscopic umbilical hernia repair;  Surgeon: Kieth BrightlySeeplaputhur G Tamaya Pun, MD;  Location: [facility] ORS;  Service: General;  Laterality: N/A;    Family History  Problem Relation Age of Onset  . Arthritis Maternal Grandmother   . Hypertension Maternal Grandmother   . Diabetes Paternal Grandfather     Social History Social History  Substance Use Topics  . Smoking status: Current Some Day Smoker    Packs/day: 1.50    Years: 12.00    Types: Cigarettes  . Smokeless tobacco: Never Used     Comment: using patch currently but if he forgets patch he will smoke   . Alcohol use 0.6 oz/week    1 Cans of beer per week     Comment: rare    No Known Allergies  Current Outpatient Prescriptions  Medication Sig Dispense Refill  . Doxycycline Hyclate 50 MG TBEC Take 50 mg by mouth daily. 50 tablet 0   No current facility-administered medications for this visit.     Review of Systems Review of Systems  Constitutional: Negative.   Respiratory: Negative.   Cardiovascular: Negative.    Blood pressure 124/78, pulse 84, resp. rate 12, height 6\' 3"  (1.905 m), weight 276 lb (125.2 kg).  Physical Exam Physical  Exam  Constitutional: He is oriented to person, place, and time. He appears well-developed and well-nourished.  Genitourinary:  Genitourinary Comments: Resolved Hidradenitis in the perineum  Neurological: He is alert and oriented to person, place, and time.  Skin: Skin is warm and dry.  Psychiatric: His behavior is normal.    Data Reviewed Prior notes  Assessment    Hidradenitis in the perineum- resolved for now. By history this has been an ongoing problem    Plan   Will try low dose of antibiotic for extended period Patient to restart abx, decreased doxycycline to 50 mg QD x 6 weeks. Follow up in clinic after completion of abx course. Advised to keep area clean. Pt advised to call if he experiences any new issues.     HPI, Physical Exam, Assessment and Plan have been scribed under the direction and in the presence of Kathreen CosierS. G. Kaoru Benda, MD  Dorathy DaftMarsha Hatch, RN  I have completed the exam and reviewed the above documentation for accuracy and completeness.  I agree with the above.  Museum/gallery conserv[employer]agon [employer] has been used and any errors in dictation or transcription are unintentional.  [name] Evette CristalSankar, M.D., F.A.C.S.  Gerlene BurdockSANKAR,[name] 06/08/2016, 1:57 PM

## 2016-06-08 NOTE — Patient Instructions (Signed)
  decrease doxycycline to 50 mg daily x 6 weeks. Follow up in clinic in 6 weeks. Advised to keep area clean, call if pt experiences any new issues

## 2016-06-20 ENCOUNTER — Other Ambulatory Visit: Payer: Self-pay | Admitting: *Deleted

## 2016-06-20 MED ORDER — DOXYCYCLINE HYCLATE 50 MG PO CAPS: 50.0000 mg | ORAL_CAPSULE | Freq: Every day | ORAL | 1 refills | Status: DC

## 2016-06-20 NOTE — Progress Notes (Signed)
New RX sent prior authorization from insurance was denied

## 2016-06-22 ENCOUNTER — Ambulatory Visit: Payer: 59 | Admitting: Sports Medicine

## 2016-06-22 ENCOUNTER — Telehealth: Payer: Self-pay | Admitting: General Surgery

## 2016-06-22 NOTE — Telephone Encounter (Signed)
Patient returning your call.He stated he's going today to get his prescription.If you need him call [phone].

## 2016-07-20 ENCOUNTER — Ambulatory Visit: Payer: 59 | Admitting: General Surgery

## 2017-09-24 ENCOUNTER — Emergency Department
Admission: EM | Admit: 2017-09-24 | Discharge: 2017-09-24 | Disposition: A | Discharge status: Home / Self Care | Payer: 59 | Attending: Emergency Medicine | Admitting: Emergency Medicine

## 2017-09-24 ENCOUNTER — Emergency Department: Payer: 59

## 2017-09-24 ENCOUNTER — Encounter: Payer: Self-pay | Admitting: Emergency Medicine

## 2017-09-24 DIAGNOSIS — Z79899 Other long term (current) drug therapy: Secondary | ICD-10-CM | POA: Insufficient documentation

## 2017-09-24 DIAGNOSIS — R0602 Shortness of breath: Secondary | ICD-10-CM | POA: Diagnosis present

## 2017-09-24 DIAGNOSIS — Z87891 Personal history of nicotine dependence: Secondary | ICD-10-CM | POA: Diagnosis not present

## 2017-09-24 DIAGNOSIS — R079 Chest pain, unspecified: Secondary | ICD-10-CM | POA: Diagnosis not present

## 2017-09-24 LAB — FIBRIN DERIVATIVES D-DIMER (ARMC ONLY): FIBRIN DERIVATIVES D-DIMER (ARMC): 686.46 ng{FEU}/mL — AB (ref 0.00–499.00)

## 2017-09-24 LAB — CBC
HCT: 42 % (ref 40.0–52.0)
HEMOGLOBIN: 14.5 g/dL (ref 13.0–18.0)
MCH: 30.5 pg (ref 26.0–34.0)
MCHC: 34.6 g/dL (ref 32.0–36.0)
MCV: 88 fL (ref 80.0–100.0)
PLATELETS: 225 10*3/uL (ref 150–440)
RBC: 4.77 MIL/uL (ref 4.40–5.90)
RDW: 13.8 % (ref 11.5–14.5)
WBC: 8.8 10*3/uL (ref 3.8–10.6)

## 2017-09-24 LAB — BASIC METABOLIC PANEL
ANION GAP: 7 (ref 5–15)
BUN: 15 mg/dL (ref 6–20)
CO2: 24 mmol/L (ref 22–32)
Calcium: 8.7 mg/dL — ABNORMAL LOW (ref 8.9–10.3)
Chloride: 107 mmol/L (ref 98–111)
Creatinine, Ser: 0.83 mg/dL (ref 0.61–1.24)
Glucose, Bld: 111 mg/dL — ABNORMAL HIGH (ref 70–99)
Potassium: 3.5 mmol/L (ref 3.5–5.1)
Sodium: 138 mmol/L (ref 135–145)

## 2017-09-24 LAB — TROPONIN I

## 2017-09-24 MED ORDER — IPRATROPIUM-ALBUTEROL 0.5-2.5 (3) MG/3ML IN SOLN
3.0000 mL | Freq: Once | RESPIRATORY_TRACT | Status: AC
  Administered 2017-09-24: 3 mL via RESPIRATORY_TRACT
  Filled 2017-09-24: qty 3

## 2017-09-24 MED ORDER — ALBUTEROL SULFATE HFA 108 (90 BASE) MCG/ACT IN AERS: 2.0000 | INHALATION_SPRAY | Freq: Four times a day (QID) | RESPIRATORY_TRACT | 0 refills | Status: DC | PRN

## 2017-09-24 MED ORDER — IOHEXOL 350 MG/ML SOLN
75.0000 mL | Freq: Once | INTRAVENOUS | Status: AC | PRN
  Administered 2017-09-24: 75 mL via INTRAVENOUS
  Filled 2017-09-24: qty 75

## 2017-09-24 MED ORDER — PREDNISONE 10 MG (21) PO TBPK: ORAL_TABLET | ORAL | 0 refills | Status: DC

## 2017-09-24 NOTE — Discharge Instructions (Addendum)
Please seek medical attention for any high fevers, chest pain, shortness of breath, change in behavior, persistent vomiting, bloody stool or any other new or concerning symptoms.  

## 2017-09-24 NOTE — ED Provider Notes (Signed)
[facility] Emergency Department Provider Note   ____________________________________________   I have reviewed the triage vital signs and the nursing notes.   HISTORY  Chief Complaint Chest Pain and Shortness of Breath   History limited by: Not Limited   HPI Albert Houston is a 42 y.o. male who presents to the emergency department today concerns for shortness of breath chest pain.  This is breath is been present for the past week.  He states that it is worse in the mornings.  Started having chest pain today.  Is located in the center part of his chest.  He states it is sharp.  He has having a hard time getting a full deep breath.  He has had some cough.  Is productive of clear phlegm.  He denies any fevers.    Per medical record review patient has a history of tobacco use  Past Medical History:  Diagnosis Date  . Flu 03-17-15   RESOLVED NOW (03-26-15)  . Headache    h/o migraine    Patient Active Problem List   Diagnosis Date Noted  . Olecranon bursitis of left elbow 05/24/2016  . Puncture wound of ankle with complication 06/13/2014  . Ingrown toenail 06/13/2014  . Left ankle injury 06/07/2014  . Encounter to establish care 03/01/2014  . Tobacco use disorder 03/01/2014  . Abscess of skin of abdomen 03/01/2014  . Low back pain 03/01/2014    Past Surgical History:  Procedure Laterality Date  . INGUINAL HERNIA REPAIR Left 04/01/2015   Procedure: LAPAROSCOPIC INGUINAL HERNIA;  Surgeon: Kieth Brightly, MD;  Location: [facility] ORS;  Service: General;  Laterality: Left;  Marland Kitchen MANDIBLE FRACTURE SURGERY  2005  . TYMPANOSTOMY TUBE PLACEMENT     As a Child  . UMBILICAL HERNIA REPAIR N/A 04/01/2015   Procedure: laproscopic umbilical hernia repair;  Surgeon: Kieth Brightly, MD;  Location: [facility] ORS;  Service: General;  Laterality: N/A;    Prior to Admission medications   Medication Sig Start Date End Date Taking? Authorizing Provider  doxycycline  (VIBRAMYCIN) 50 MG capsule Take 1 capsule (50 mg total) by mouth daily. 06/20/16   Kieth Brightly, MD  Doxycycline Hyclate 50 MG TBEC Take 50 mg by mouth daily. 06/08/16   Kieth Brightly, MD    Allergies Patient has no known allergies.  Family History  Problem Relation Age of Onset  . Arthritis Maternal Grandmother   . Hypertension Maternal Grandmother   . Diabetes Paternal Grandfather     Social History Social History   Tobacco Use  . Smoking status: Former Smoker    Packs/day: 1.50    Years: 12.00    Pack years: 18.00    Types: Cigarettes    Last attempt to quit: 01/09/2017    Years since quitting: 0.7  . Smokeless tobacco: Never Used  . Tobacco comment: using patch currently but if he forgets patch he will smoke   Substance Use Topics  . Alcohol use: Yes    Alcohol/week: 1.0 standard drinks    Types: 1 Cans of beer per week    Comment: rare  . Drug use: No    Review of Systems Constitutional: No fever/chills Eyes: No visual changes. ENT: No sore throat. Cardiovascular: Positive for chest pain  Respiratory: Positive for shortness of breath Gastrointestinal: No abdominal pain.  No nausea, no vomiting.  No diarrhea.   Genitourinary: Negative for dysuria. Musculoskeletal: Negative for back pain. Skin: Negative for rash. Neurological: Negative for headaches,  focal weakness or numbness.  ____________________________________________   PHYSICAL EXAM:  VITAL SIGNS: ED Triage Vitals  Enc Vitals Group     BP 09/24/17 1413 (!) 151/77     Pulse Rate 09/24/17 1413 98     Resp 09/24/17 1413 18     Temp 09/24/17 1413 98.4 F (36.9 C)     Temp Source 09/24/17 1413 Oral     SpO2 09/24/17 1413 97 %     Weight 09/24/17 1412 280 lb (127 kg)     Height 09/24/17 1412 6\' 3"  (1.905 m)     Head Circumference --      Peak Flow --      Pain Score 09/24/17 1411 0   Constitutional: Alert and oriented.  Eyes: Conjunctivae are normal.  ENT      Head:  Normocephalic and atraumatic.      Nose: No congestion/rhinnorhea.      Mouth/Throat: Mucous membranes are moist.      Neck: No stridor. Hematological/Lymphatic/Immunilogical: No cervical lymphadenopathy. Cardiovascular: Normal rate, regular rhythm.  No murmurs, rubs, or gallops.  Respiratory: Normal respiratory effort without tachypnea nor retractions. Breath sounds are clear and equal bilaterally. No wheezes/rales/rhonchi. Gastrointestinal: Soft and non tender. No rebound. No guarding.  Genitourinary: Deferred Musculoskeletal: Normal range of motion in all extremities. No lower extremity edema. Neurologic:  Normal speech and language. No gross focal neurologic deficits are appreciated.  Skin:  Skin is warm, dry and intact. No rash noted. Psychiatric: Mood and affect are normal. Speech and behavior are normal. Patient exhibits appropriate insight and judgment.  ____________________________________________    LABS (pertinent positives/negatives)  Trop <0.03 CBC wnl BMP wnl except glu 111, ca 8.7  ____________________________________________   EKG  I, Phineas SemenGraydon Akita Maxim, attending physician, personally viewed and interpreted this EKG  EKG Time: 1411 Rate: 100 Rhythm: normal sinus rhythm Axis: normal Intervals: qtc 433 QRS: narrow ST changes: no st elevation Impression: normal ekg   ____________________________________________    RADIOLOGY  CXR No edema, no consolidation  ____________________________________________   PROCEDURES  Procedures  ____________________________________________   INITIAL IMPRESSION / ASSESSMENT AND PLAN / ED COURSE  Pertinent labs & imaging results that were available during my care of the patient were reviewed by me and considered in my medical decision making (see chart for details).   Patient presented to the emergency department today because of concerns for shortness of breath coughing chest pain.  The chest pain was somewhat  pleuritic.  Patient's x-ray without any obvious etiology.  D-dimer was sent given nature of pain and this was elevated.  CT Angie was performed without evidence of PE.  Additionally did not see any other etiology.  Patient did feel somewhat better after DuoNeb treatments.  Will plan on treating for possible bronchitis.  Will give patient inhaler and steroids.  Discussed findings and plan with patient.   ____________________________________________   FINAL CLINICAL IMPRESSION(S) / ED DIAGNOSES  Final diagnoses:  Shortness of breath     Note: This dictation was prepared with Dragon dictation. Any transcriptional errors that result from this process are unintentional     Phineas SemenGoodman, Melaysia Streed, MD 09/24/17 2207

## 2017-09-24 NOTE — ED Triage Notes (Addendum)
Patient presents to the ED with waking up with shortness of breath this morning.  Patient states, "I don't feel like I can get a deep breath."  Patient reports congested cough x 1.5 weeks.  Patient is also complaining of sharp centralized chest pain radiating into his right arm.  Patient reports feeling very short of breath with small amount of exertion.

## 2019-01-01 ENCOUNTER — Other Ambulatory Visit: Payer: Self-pay | Admitting: Sports Medicine

## 2019-01-01 DIAGNOSIS — M7052 Other bursitis of knee, left knee: Secondary | ICD-10-CM

## 2019-01-01 DIAGNOSIS — S8002XD Contusion of left knee, subsequent encounter: Secondary | ICD-10-CM

## 2019-01-01 DIAGNOSIS — W19XXXD Unspecified fall, subsequent encounter: Secondary | ICD-10-CM

## 2019-01-01 DIAGNOSIS — S8992XD Unspecified injury of left lower leg, subsequent encounter: Secondary | ICD-10-CM

## 2019-01-16 ENCOUNTER — Encounter: Payer: Self-pay | Admitting: Radiology

## 2019-01-16 ENCOUNTER — Ambulatory Visit
Admission: RE | Admit: 2019-01-16 | Discharge: 2019-01-16 | Disposition: A | Discharge status: Home / Self Care | Payer: BC Managed Care – PPO | Source: Ambulatory Visit | Attending: Sports Medicine | Admitting: Sports Medicine

## 2019-01-16 ENCOUNTER — Other Ambulatory Visit: Payer: Self-pay

## 2019-01-16 DIAGNOSIS — M7122 Synovial cyst of popliteal space [Baker], left knee: Secondary | ICD-10-CM | POA: Insufficient documentation

## 2019-01-16 DIAGNOSIS — S8992XD Unspecified injury of left lower leg, subsequent encounter: Secondary | ICD-10-CM | POA: Diagnosis present

## 2019-01-16 DIAGNOSIS — S8002XD Contusion of left knee, subsequent encounter: Secondary | ICD-10-CM | POA: Diagnosis present

## 2019-01-16 DIAGNOSIS — W19XXXD Unspecified fall, subsequent encounter: Secondary | ICD-10-CM | POA: Insufficient documentation

## 2019-01-16 DIAGNOSIS — M7052 Other bursitis of knee, left knee: Secondary | ICD-10-CM

## 2019-03-03 ENCOUNTER — Other Ambulatory Visit: Payer: Self-pay | Admitting: Orthopedic Surgery

## 2019-03-03 DIAGNOSIS — M25562 Pain in left knee: Secondary | ICD-10-CM

## 2019-03-03 NOTE — Progress Notes (Signed)
Bilateral standing hip to ankle x-rays on one cassette for evaluation of alignment 

## 2019-03-12 ENCOUNTER — Other Ambulatory Visit: Payer: Self-pay

## 2019-03-12 ENCOUNTER — Ambulatory Visit
Admission: RE | Admit: 2019-03-12 | Discharge: 2019-03-12 | Disposition: A | Discharge status: Home / Self Care | Payer: BC Managed Care – PPO | Source: Ambulatory Visit | Attending: Orthopedic Surgery | Admitting: Orthopedic Surgery

## 2019-03-12 ENCOUNTER — Ambulatory Visit
Admission: RE | Admit: 2019-03-12 | Discharge: 2019-03-12 | Disposition: A | Discharge status: Home / Self Care | Payer: BC Managed Care – PPO | Source: Ambulatory Visit | Attending: Sports Medicine | Admitting: Sports Medicine

## 2019-03-12 DIAGNOSIS — M25562 Pain in left knee: Secondary | ICD-10-CM | POA: Insufficient documentation

## 2019-03-31 ENCOUNTER — Other Ambulatory Visit: Payer: Self-pay | Admitting: Orthopedic Surgery

## 2019-04-04 ENCOUNTER — Encounter
Admission: RE | Admit: 2019-04-04 | Discharge: 2019-04-04 | Disposition: A | Discharge status: Home / Self Care | Payer: BC Managed Care – PPO | Source: Ambulatory Visit | Attending: Orthopedic Surgery | Admitting: Orthopedic Surgery

## 2019-04-04 ENCOUNTER — Other Ambulatory Visit: Payer: Self-pay

## 2019-04-04 DIAGNOSIS — Z01818 Encounter for other preprocedural examination: Secondary | ICD-10-CM | POA: Insufficient documentation

## 2019-04-04 NOTE — Patient Instructions (Signed)
Your procedure is scheduled on: 04/14/19 Report to [facility]. To find out your arrival time please call [phone] between 1PM - 3PM on 04/11/19.  Remember: Instructions that are not followed completely may result in serious medical risk, up to and including death, or upon the discretion of your surgeon and anesthesiologist your surgery may need to be rescheduled.     _X__ 1. Do not eat food after midnight the night before your procedure.                 No gum chewing or hard candies. You may drink clear liquids up to 2 hours                 before you are scheduled to arrive for your surgery- DO not drink clear                 liquids within 2 hours of the start of your surgery.                 Clear Liquids include:  water, apple juice without pulp, clear carbohydrate                 drink such as Clearfast or Gatorade, Black Coffee or Tea (Do not add                 anything to coffee or tea). Diabetics water only  __X__2.  On the morning of surgery brush your teeth with toothpaste and water, you                 may rinse your mouth with mouthwash if you wish.  Do not swallow any              toothpaste of mouthwash.     _X__ 3.  No Alcohol for 24 hours before or after surgery.   _X__ 4.  Do Not Smoke or use e-cigarettes For 24 Hours Prior to Your Surgery.                 Do not use any chewable tobacco products for at least 6 hours prior to                 surgery.  ____  5.  Bring all medications with you on the day of surgery if instructed.   __X__  6.  Notify your doctor if there is any change in your medical condition      (cold, fever, infections).     Do not wear jewelry, make-up, hairpins, clips or nail polish. Do not wear lotions, powders, or perfumes.  Do not shave 48 hours prior to surgery. Men may shave face and neck. Do not bring valuables to the hospital.    [facility] is not responsible for any belongings or  valuables.  Contacts, dentures/partials or body piercings may not be worn into surgery. Bring a case for your contacts, glasses or hearing aids, a denture cup will be supplied. Leave your suitcase in the car. After surgery it may be brought to your room. For patients admitted to the hospital, discharge time is determined by your treatment team.   Patients discharged the day of surgery will not be allowed to drive home.   Please read over the following fact sheets that you were given:   MRSA Information  __X__ Take these medicines the morning of surgery with A SIP OF WATER:  1. none  2.   3.   4.  5.  6.  ____ Fleet Enema (as directed)   __X__ Use CHG Soap/SAGE wipes as directed  (written instructions provided)  ____ Use inhalers on the day of surgery  ____ Stop metformin/Janumet/Farxiga 2 days prior to surgery    ____ Take 1/2 of usual insulin dose the night before surgery. No insulin the morning          of surgery.   ____ Stop Blood Thinners Coumadin/Plavix/Xarelto/Pleta/Pradaxa/Eliquis/Effient/Aspirin  on   Or contact your Surgeon, Cardiologist or Medical Doctor regarding  ability to stop your blood thinners  __X__ Stop Anti-inflammatories 7 days before surgery such as Advil, Ibuprofen, Motrin,  BC or Goodies Powder, Naprosyn, Naproxen, Aleve, Aspirin    __X__ Stop all herbal supplements, fish oil or vitamin E until after surgery.    ____ Bring C-Pap to the hospital.       

## 2019-04-10 ENCOUNTER — Other Ambulatory Visit
Admission: RE | Admit: 2019-04-10 | Discharge: 2019-04-10 | Disposition: A | Discharge status: Home / Self Care | Payer: BC Managed Care – PPO | Source: Ambulatory Visit | Attending: Orthopedic Surgery | Admitting: Orthopedic Surgery

## 2019-04-10 ENCOUNTER — Other Ambulatory Visit: Payer: Self-pay

## 2019-04-10 DIAGNOSIS — Z20822 Contact with and (suspected) exposure to covid-19: Secondary | ICD-10-CM | POA: Diagnosis not present

## 2019-04-10 DIAGNOSIS — Z01812 Encounter for preprocedural laboratory examination: Secondary | ICD-10-CM | POA: Insufficient documentation

## 2019-04-11 LAB — SARS CORONAVIRUS 2 (TAT 6-24 HRS): SARS Coronavirus 2: NEGATIVE

## 2019-04-13 MED ORDER — DEXTROSE 5 % IV SOLN
3.0000 g | INTRAVENOUS | Status: AC
  Administered 2019-04-14: 08:00:00 3 g via INTRAVENOUS
  Filled 2019-04-13: qty 3

## 2019-04-14 ENCOUNTER — Ambulatory Visit: Payer: BC Managed Care – PPO | Admitting: Anesthesiology

## 2019-04-14 ENCOUNTER — Encounter
Admission: RE | Disposition: A | Discharge status: Home / Self Care | Payer: Self-pay | Source: Home / Self Care | Attending: Orthopedic Surgery

## 2019-04-14 ENCOUNTER — Ambulatory Visit: Payer: BC Managed Care – PPO

## 2019-04-14 ENCOUNTER — Encounter: Payer: Self-pay | Admitting: Orthopedic Surgery

## 2019-04-14 ENCOUNTER — Observation Stay
Admission: RE | Admit: 2019-04-14 | Discharge: 2019-04-15 | Disposition: A | Discharge status: Home / Self Care | Payer: BC Managed Care – PPO | Attending: Orthopedic Surgery | Admitting: Orthopedic Surgery

## 2019-04-14 ENCOUNTER — Observation Stay: Payer: BC Managed Care – PPO

## 2019-04-14 ENCOUNTER — Other Ambulatory Visit: Payer: Self-pay

## 2019-04-14 DIAGNOSIS — M21169 Varus deformity, not elsewhere classified, unspecified knee: Secondary | ICD-10-CM | POA: Diagnosis present

## 2019-04-14 DIAGNOSIS — Z87891 Personal history of nicotine dependence: Secondary | ICD-10-CM | POA: Insufficient documentation

## 2019-04-14 DIAGNOSIS — Z9889 Other specified postprocedural states: Secondary | ICD-10-CM

## 2019-04-14 DIAGNOSIS — X501XXA Overexertion from prolonged static or awkward postures, initial encounter: Secondary | ICD-10-CM | POA: Diagnosis not present

## 2019-04-14 DIAGNOSIS — M21162 Varus deformity, not elsewhere classified, left knee: Principal | ICD-10-CM | POA: Insufficient documentation

## 2019-04-14 DIAGNOSIS — J4520 Mild intermittent asthma, uncomplicated: Secondary | ICD-10-CM | POA: Insufficient documentation

## 2019-04-14 DIAGNOSIS — S83242A Other tear of medial meniscus, current injury, left knee, initial encounter: Secondary | ICD-10-CM | POA: Insufficient documentation

## 2019-04-14 DIAGNOSIS — Z419 Encounter for procedure for purposes other than remedying health state, unspecified: Secondary | ICD-10-CM

## 2019-04-14 HISTORY — PX: KNEE ARTHROSCOPY: SHX127

## 2019-04-14 SURGERY — ARTHROSCOPY, KNEE
Anesthesia: General | Site: Knee | Laterality: Left

## 2019-04-14 MED ORDER — CHLORHEXIDINE GLUCONATE 4 % EX LIQD: 60.0000 mL | Freq: Once | CUTANEOUS | Status: DC

## 2019-04-14 MED ORDER — LIDOCAINE HCL (CARDIAC) PF 100 MG/5ML IV SOSY
PREFILLED_SYRINGE | INTRAVENOUS | Status: DC | PRN
  Administered 2019-04-14: 80 mg via INTRAVENOUS

## 2019-04-14 MED ORDER — SUCCINYLCHOLINE CHLORIDE 20 MG/ML IJ SOLN
INTRAMUSCULAR | Status: DC | PRN
  Administered 2019-04-14: 140 mg via INTRAVENOUS

## 2019-04-14 MED ORDER — BUPIVACAINE LIPOSOME 1.3 % IJ SUSP
INTRAMUSCULAR | Status: AC
  Filled 2019-04-14: qty 20

## 2019-04-14 MED ORDER — METHOCARBAMOL 1000 MG/10ML IJ SOLN
500.0000 mg | Freq: Four times a day (QID) | INTRAVENOUS | Status: DC | PRN
  Filled 2019-04-14: qty 5

## 2019-04-14 MED ORDER — SUGAMMADEX SODIUM 500 MG/5ML IV SOLN
INTRAVENOUS | Status: DC | PRN
  Administered 2019-04-14: 300 mg via INTRAVENOUS

## 2019-04-14 MED ORDER — LACTATED RINGERS IV SOLN: INTRAVENOUS | Status: DC

## 2019-04-14 MED ORDER — METHOCARBAMOL 500 MG PO TABS
500.0000 mg | ORAL_TABLET | Freq: Four times a day (QID) | ORAL | Status: DC | PRN
  Administered 2019-04-14: 500 mg via ORAL
  Filled 2019-04-14: qty 1

## 2019-04-14 MED ORDER — OXYCODONE HCL 5 MG PO TABS: 5.0000 mg | ORAL_TABLET | ORAL | Status: DC | PRN

## 2019-04-14 MED ORDER — SENNOSIDES-DOCUSATE SODIUM 8.6-50 MG PO TABS
1.0000 | ORAL_TABLET | Freq: Every evening | ORAL | Status: DC | PRN
  Filled 2019-04-14: qty 1

## 2019-04-14 MED ORDER — FENTANYL CITRATE (PF) 100 MCG/2ML IJ SOLN
INTRAMUSCULAR | Status: AC
  Administered 2019-04-14: 25 ug via INTRAVENOUS
  Filled 2019-04-14: qty 2

## 2019-04-14 MED ORDER — OXYCODONE HCL 5 MG PO TABS
10.0000 mg | ORAL_TABLET | ORAL | Status: DC | PRN
  Administered 2019-04-14 – 2019-04-15 (×3): 10 mg via ORAL
  Filled 2019-04-14 (×3): qty 2

## 2019-04-14 MED ORDER — ACETAMINOPHEN 10 MG/ML IV SOLN
INTRAVENOUS | Status: AC
  Filled 2019-04-14: qty 100

## 2019-04-14 MED ORDER — DOCUSATE SODIUM 100 MG PO CAPS
100.0000 mg | ORAL_CAPSULE | Freq: Two times a day (BID) | ORAL | Status: DC
  Administered 2019-04-14 – 2019-04-15 (×2): 100 mg via ORAL
  Filled 2019-04-14 (×2): qty 1

## 2019-04-14 MED ORDER — BUPIVACAINE HCL 0.5 % IJ SOLN: INTRAMUSCULAR | Status: DC | PRN

## 2019-04-14 MED ORDER — ONDANSETRON HCL 4 MG PO TABS: 4.0000 mg | ORAL_TABLET | Freq: Four times a day (QID) | ORAL | Status: DC | PRN

## 2019-04-14 MED ORDER — FENTANYL CITRATE (PF) 250 MCG/5ML IJ SOLN
INTRAMUSCULAR | Status: AC
  Filled 2019-04-14: qty 5

## 2019-04-14 MED ORDER — PROPOFOL 10 MG/ML IV BOLUS
INTRAVENOUS | Status: AC
  Filled 2019-04-14: qty 20

## 2019-04-14 MED ORDER — FENTANYL CITRATE (PF) 100 MCG/2ML IJ SOLN
INTRAMUSCULAR | Status: DC | PRN
  Administered 2019-04-14: 100 ug via INTRAVENOUS
  Administered 2019-04-14 (×2): 50 ug via INTRAVENOUS

## 2019-04-14 MED ORDER — BUPIVACAINE HCL (PF) 0.5 % IJ SOLN
INTRAMUSCULAR | Status: AC
  Filled 2019-04-14: qty 30

## 2019-04-14 MED ORDER — HYDROMORPHONE HCL 1 MG/ML IJ SOLN
0.2000 mg | INTRAMUSCULAR | Status: DC | PRN
  Administered 2019-04-14: 0.4 mg via INTRAVENOUS
  Filled 2019-04-14: qty 1

## 2019-04-14 MED ORDER — MIDAZOLAM HCL 2 MG/2ML IJ SOLN
INTRAMUSCULAR | Status: AC
  Filled 2019-04-14: qty 2

## 2019-04-14 MED ORDER — ONDANSETRON HCL 4 MG/2ML IJ SOLN
INTRAMUSCULAR | Status: DC | PRN
  Administered 2019-04-14: 4 mg via INTRAVENOUS

## 2019-04-14 MED ORDER — HYDROMORPHONE HCL 1 MG/ML IJ SOLN
INTRAMUSCULAR | Status: DC | PRN
  Administered 2019-04-14 (×2): .5 mg via INTRAVENOUS

## 2019-04-14 MED ORDER — ROCURONIUM BROMIDE 100 MG/10ML IV SOLN
INTRAVENOUS | Status: DC | PRN
  Administered 2019-04-14: 10 mg via INTRAVENOUS
  Administered 2019-04-14: 5 mg via INTRAVENOUS
  Administered 2019-04-14: 10 mg via INTRAVENOUS
  Administered 2019-04-14: 45 mg via INTRAVENOUS

## 2019-04-14 MED ORDER — LACTATED RINGERS IR SOLN
Status: DC | PRN
  Administered 2019-04-14: 4

## 2019-04-14 MED ORDER — HYDROMORPHONE HCL 1 MG/ML IJ SOLN
INTRAMUSCULAR | Status: AC
  Filled 2019-04-14: qty 1

## 2019-04-14 MED ORDER — FAMOTIDINE 20 MG PO TABS
ORAL_TABLET | ORAL | Status: AC
  Administered 2019-04-14: 20 mg via ORAL
  Filled 2019-04-14: qty 1

## 2019-04-14 MED ORDER — LIDOCAINE-EPINEPHRINE 1 %-1:100000 IJ SOLN
INTRAMUSCULAR | Status: AC
  Filled 2019-04-14: qty 1

## 2019-04-14 MED ORDER — SODIUM CHLORIDE 0.9 % IV SOLN: INTRAVENOUS | Status: DC

## 2019-04-14 MED ORDER — OXYCODONE HCL 5 MG PO TABS
5.0000 mg | ORAL_TABLET | ORAL | Status: DC | PRN
  Administered 2019-04-15: 5 mg via ORAL
  Filled 2019-04-14: qty 1

## 2019-04-14 MED ORDER — ACETAMINOPHEN 500 MG PO TABS
1000.0000 mg | ORAL_TABLET | Freq: Four times a day (QID) | ORAL | Status: DC
  Administered 2019-04-14 – 2019-04-15 (×2): 1000 mg via ORAL
  Filled 2019-04-14 (×2): qty 2

## 2019-04-14 MED ORDER — OXYCODONE HCL 5 MG PO TABS
10.0000 mg | ORAL_TABLET | ORAL | Status: DC | PRN
  Administered 2019-04-14: 10 mg via ORAL
  Filled 2019-04-14: qty 2

## 2019-04-14 MED ORDER — ASPIRIN EC 325 MG PO TBEC
325.0000 mg | DELAYED_RELEASE_TABLET | Freq: Every day | ORAL | Status: DC
  Administered 2019-04-15: 325 mg via ORAL
  Filled 2019-04-14: qty 1

## 2019-04-14 MED ORDER — LIDOCAINE HCL (PF) 2 % IJ SOLN
INTRAMUSCULAR | Status: AC
  Filled 2019-04-14: qty 10

## 2019-04-14 MED ORDER — BUPIVACAINE LIPOSOME 1.3 % IJ SUSP
INTRAMUSCULAR | Status: DC | PRN
  Administered 2019-04-14: 50 mL

## 2019-04-14 MED ORDER — ACETAMINOPHEN 10 MG/ML IV SOLN
INTRAVENOUS | Status: DC | PRN
  Administered 2019-04-14: 1000 mg via INTRAVENOUS

## 2019-04-14 MED ORDER — FENTANYL CITRATE (PF) 100 MCG/2ML IJ SOLN
25.0000 ug | INTRAMUSCULAR | Status: AC | PRN
  Administered 2019-04-14 (×6): 25 ug via INTRAVENOUS

## 2019-04-14 MED ORDER — DIPHENHYDRAMINE HCL 12.5 MG/5ML PO ELIX
12.5000 mg | ORAL_SOLUTION | ORAL | Status: DC | PRN
  Filled 2019-04-14: qty 10

## 2019-04-14 MED ORDER — PROPOFOL 10 MG/ML IV BOLUS
INTRAVENOUS | Status: DC | PRN
  Administered 2019-04-14: 200 mg via INTRAVENOUS

## 2019-04-14 MED ORDER — ONDANSETRON HCL 4 MG/2ML IJ SOLN: 4.0000 mg | Freq: Once | INTRAMUSCULAR | Status: DC | PRN

## 2019-04-14 MED ORDER — MIDAZOLAM HCL 2 MG/2ML IJ SOLN
INTRAMUSCULAR | Status: DC | PRN
  Administered 2019-04-14 (×2): 1 mg via INTRAVENOUS

## 2019-04-14 MED ORDER — ONDANSETRON HCL 4 MG/2ML IJ SOLN: 4.0000 mg | Freq: Four times a day (QID) | INTRAMUSCULAR | Status: DC | PRN

## 2019-04-14 MED ORDER — DEXTROSE 5 % IV SOLN
3.0000 g | Freq: Four times a day (QID) | INTRAVENOUS | Status: AC
  Administered 2019-04-14 – 2019-04-15 (×3): 3 g via INTRAVENOUS
  Filled 2019-04-14 (×3): qty 3

## 2019-04-14 MED ORDER — FAMOTIDINE 20 MG PO TABS: 20.0000 mg | ORAL_TABLET | Freq: Once | ORAL | Status: AC

## 2019-04-14 SURGICAL SUPPLY — 63 items
ADAPTER IRRIG TUBE 2 SPIKE SOL (ADAPTER) ×4 IMPLANT
ANCHOR IBALANCE HTO 6.5X28 (Anchor) ×2 IMPLANT
ANCHOR IBLANCE  HTO 4.5X42 (Anchor) ×1 IMPLANT
ANCHOR IBLANCE  HTO 6.5X30 (Anchor) ×1 IMPLANT
ANCHOR IBLANCE HTO 4.5X42 (Anchor) ×1 IMPLANT
ANCHOR IBLANCE HTO 6.5X30 (Anchor) ×1 IMPLANT
ANCHOR SUT HTO 4.5X36 (Anchor) ×2 IMPLANT
BLADE SAGITTAL AGGR TOOTH XLG (BLADE) ×2 IMPLANT
BLADE SURG SZ11 CARB STEEL (BLADE) ×2 IMPLANT
BNDG ESMARK 6X12 TAN STRL LF (GAUZE/BANDAGES/DRESSINGS) IMPLANT
BRUSH SCRUB EZ  4% CHG (MISCELLANEOUS) ×1
BRUSH SCRUB EZ 4% CHG (MISCELLANEOUS) ×1 IMPLANT
BUR RADIUS 3.5 (BURR) IMPLANT
BUR RADIUS 4.0X18.5 (BURR) IMPLANT
CHLORAPREP W/TINT 26 (MISCELLANEOUS) ×2 IMPLANT
COOLER POLAR GLACIER W/PUMP (MISCELLANEOUS) ×2 IMPLANT
COVER WAND RF STERILE (DRAPES) ×2 IMPLANT
CUFF TOURN SGL QUICK 24 (TOURNIQUET CUFF)
CUFF TOURN SGL QUICK 30 (TOURNIQUET CUFF)
CUFF TRNQT CYL 24X4X16.5-23 (TOURNIQUET CUFF) IMPLANT
CUFF TRNQT CYL 30X4X21-28X (TOURNIQUET CUFF) IMPLANT
DRAPE IMP U-DRAPE 54X76 (DRAPES) ×2 IMPLANT
FILLER BONE VOID 7X3X30X12 (Miscellaneous) ×4 IMPLANT
GAUZE SPONGE 4X4 12PLY STRL (GAUZE/BANDAGES/DRESSINGS) ×2 IMPLANT
GLOVE BIOGEL PI IND STRL 8 (GLOVE) ×1 IMPLANT
GLOVE BIOGEL PI INDICATOR 8 (GLOVE) ×1
GLOVE SURG ORTHO 8.0 STRL STRW (GLOVE) ×2 IMPLANT
GLOVE SURG SYN 7.5  E (GLOVE) ×1
GLOVE SURG SYN 7.5 E (GLOVE) ×1 IMPLANT
GOWN SRG XL 47XLVL 3 (GOWN DISPOSABLE) ×1 IMPLANT
GOWN STRL NON-REIN TWL XL LVL3 (GOWN DISPOSABLE) ×1
GOWN STRL REUS W/ TWL LRG LVL3 (GOWN DISPOSABLE) ×1 IMPLANT
GOWN STRL REUS W/TWL LRG LVL3 (GOWN DISPOSABLE) ×1
IMPL HTO IBLANCE LG7 XL6 (Knees) ×1 IMPLANT
IMPLANT HTO IBLANCE LG7 XL6 (Knees) ×2 IMPLANT
IV LACTATED RINGER IRRG 3000ML (IV SOLUTION) ×4
IV LR IRRIG 3000ML ARTHROMATIC (IV SOLUTION) ×4 IMPLANT
KIT BIO-TENODESIS 3X8 DISP (MISCELLANEOUS) ×1
KIT INSRT BABSR STRL DISP BTN (MISCELLANEOUS) ×1 IMPLANT
KIT TURNOVER KIT A (KITS) ×2 IMPLANT
MANIFOLD NEPTUNE II (INSTRUMENTS) ×2 IMPLANT
MAT ABSORB  FLUID 56X50 GRAY (MISCELLANEOUS) ×1
MAT ABSORB FLUID 56X50 GRAY (MISCELLANEOUS) ×1 IMPLANT
NEEDLE HYPO 22GX1.5 SAFETY (NEEDLE) ×2 IMPLANT
PACK ARTHROSCOPY KNEE (MISCELLANEOUS) ×2 IMPLANT
PAD ABD DERMACEA PRESS 5X9 (GAUZE/BANDAGES/DRESSINGS) ×4 IMPLANT
PAD WRAPON POLAR KNEE (MISCELLANEOUS) ×1 IMPLANT
PENCIL SMOKE ULTRAEVAC 22 CON (MISCELLANEOUS) ×2 IMPLANT
SET TUBE SUCT SHAVER OUTFL 24K (TUBING) ×2 IMPLANT
SET TUBE TIP INTRA-ARTICULAR (MISCELLANEOUS) ×2 IMPLANT
STRIP CLOSURE SKIN 1/2X4 (GAUZE/BANDAGES/DRESSINGS) ×2 IMPLANT
SUT ETHILON 4-0 (SUTURE) ×1
SUT ETHILON 4-0 FS2 18XMFL BLK (SUTURE) ×1
SUT MNCRL 4-0 (SUTURE)
SUT MNCRL 4-0 27XMFL (SUTURE)
SUT VIC AB 0 CT1 36 (SUTURE) IMPLANT
SUT VIC AB 2-0 CT2 27 (SUTURE) IMPLANT
SUTURE ETHLN 4-0 FS2 18XMF BLK (SUTURE) ×1 IMPLANT
SUTURE MNCRL 4-0 27XMF (SUTURE) IMPLANT
TUBING ARTHRO INFLOW-ONLY STRL (TUBING) ×2 IMPLANT
WAND HAND CNTRL MULTIVAC 50 (MISCELLANEOUS) IMPLANT
WAND WEREWOLF FLOW 90D (MISCELLANEOUS) IMPLANT
WRAPON POLAR PAD KNEE (MISCELLANEOUS) ×2

## 2019-04-14 NOTE — Anesthesia Procedure Notes (Signed)
Procedure Name: Intubation Date/Time: 04/14/2019 7:44 AM Performed by: Henrietta Hoover, CRNA Pre-anesthesia Checklist: Patient identified, Patient being monitored, Timeout performed, Emergency Drugs available and Suction available Patient Re-evaluated:Patient Re-evaluated prior to induction Oxygen Delivery Method: Circle system utilized Preoxygenation: Pre-oxygenation with 100% oxygen Induction Type: IV induction Ventilation: Mask ventilation without difficulty Laryngoscope Size: McGraph and 4 Grade View: Grade I Tube type: Oral Tube size: 7.5 mm Number of attempts: 1 Airway Equipment and Method: Stylet Placement Confirmation: ETT inserted through vocal cords under direct vision,  positive ETCO2 and breath sounds checked- equal and bilateral Secured at: 23 cm Tube secured with: Tape Dental Injury: Teeth and Oropharynx as per pre-operative assessment

## 2019-04-14 NOTE — Op Note (Signed)
DATE: 04/14/2019   PRE-OP DIAGNOSIS:  1. Left genu varum 2. Left medial compartment degenerative changes   POST-OP DIAGNOSIS:  1. Left genu varum 2. Left medial meniscus tear 3. Left medial compartment degenerative changes  PROCEDURES:  1. Left knee high tibial osteotomy  2. Left knee arthroscopic partial medial meniscectomy 2. Left knee medial compartment and patella chondroplasty   SURGEON:  Langston Reusing, MD   ANESTHESIA: General   TOTAL IV FLUIDS: See anesthesia record   ESTIMATED BLOOD LOSS: 25cc   TOURNIQUET TIME:  130 min   DRAINS:  None.   SPECIMENS: None   IMPLANTS:  - Arthrex iBalance HTO Implant, Large 7 degree - Arthrex iBalance screws (two 6.82m cancellous and two 4.553mcortical) - Arthrex Osferion bone void filler - [id number]    COMPLICATIONS: None   INDICATIONS: JaKEYMARI SATOs a [age].o. male with knee pain for approximately 4 months.  At that point, the patient had a twisting injury to his knee.  Since then, he has had severe pain and difficulty with knee range of motion.  Eventual MRI showed a large region of grade 4 medial compartment degenerative changes with subchondral cystic changes as well as some patellofemoral degenerative changes.  Standing alignment films demonstrated significant varus malalignment.  He had failed significant nonoperative management and had difficulty with work.  Given degenerative changes to the medial compartment as well as varus malalignment, and relatively young age, we elected to proceed with the above procedure after a discussion of the risks, benefits, and alternatives to surgery.   OPERATIVE FINDINGS:    Examination under anesthesia: A careful examination under anesthesia was performed.  Passive range of motion was: Hyperextension: 1.  Extension: 0.  Flexion: 125.  Lachman: normal. Pivot Shift: normal.  Posterior drawer: normal.  Varus stability in full extension: normal.  Varus stability in 30 degrees of  flexion: normal.  Valgus stability in full extension: normal.  Valgus stability in 30 degrees of flexion: normal.   Intra-operative findings: A thorough arthroscopic examination of the knee was performed.  The findings are: 1. Suprapatellar pouch: Normal 2. Undersurface of median ridge: Grade 3 degenerative changes 3. Medial patellar facet: Grade 3 degenerative changes 4. Lateral patellar facet: Grade 1-2 degenerative changes 5. Trochlea: Grade 2 degenerative changes 6. Lateral gutter/popliteus tendon: Normal 7. Hoffa's fat pad: Inflamed 8. Medial gutter/plica: Normal 9. ACL: Normal 10. PCL: Normal 11. Medial meniscus: Region of slight tearing at the posterior horn/body junction affecting approximately 10% of the meniscus width  12. Medial compartment cartilage: Large area approximately 25 x 15 mm of grade 3-4 degenerative changes on the medial femoral condyle; diffuse grade 1-2 degenerative changes the tibial plateau 13. Lateral meniscus: Normal 14. Lateral compartment cartilage: Few areas of grade 1 softening of the central lateral tibial plateau; normal lateral femoral condyle  DESCRIPTION OF PROCEDURE: I identified JaEster Rinkn the pre-operative holding area.  I marked the operative knee with my initials. I reviewed the risks and benefits of the proposed surgical intervention and the patient wished to proceed. The patient was transferred to the operative suite and placed in the supine position with all bony prominences padded.  Anesthesia was administered. Appropriate IV antibiotics were administered prior to incision. The extremity was then prepped and draped in standard fashion. A time out was performed confirming the correct extremity, correct patient, and correct procedure.   Arthroscopy portals were marked. Local anesthetic was injected to the planned portal sites.  An EsGeographical information systems officer  bandage was used to exsanguinate the leg and tourniquet was inflated.  The anterolateral portal was  established with an 11 blade. The arthroscope was placed in the anterolateral portal and then into the suprapatellar pouch.  A diagnostic knee scope was completed with the above findings. Next the medial portal was established under needle localization. The meniscus tear was identified and probed to confirm our findings.  An oscillating shaver and arthroscopic biter were used to debride the meniscus tear until it had stable edges.  This involved resection of approximately 10% of the meniscus width with preservation of most of the meniscus.  A chondroplasty was performed of the medial femoral condyle and medial tibial plateau with an oscillating shaver until there were stable cartilage edges.  This was also performed for the patella in a similar fashion.    Next, I turned my attention to performing the high tibial osteotomy.  An approximately 10 cm longitudinal incision was utilized with the superior aspect of this incorporating the anteromedial portal.  Dissection was carried bluntly down to the sartorius fascia.  The pes tendons were identified.  The patellar tendon was identified as well.  First, the medial aspect of the patellar tendon was incised and a retractor was placed underneath the patellar tendon to protect it through the remainder of the case.  The medial aspect of the patellar tendon insertion medially was slightly elevated to expose the anterior cortex of the tibia in the region of the planned osteotomy.  Next, an L-shaped flap of the sartorius fascia and MCL was elevated subperiosteally using combination of Bovie electrocautery and Cobb elevators.  The posterior elevator was then utilized to elevate any tissue off of the posterior cortex of the tibia.   Then, using fluoroscopy and the knee slightly flexed, an appropriate AP view was obtained such that the lateral tibial plateau was in profile.  Rotation was controlled such that there was 50% overlap of the fibular head with the lateral tibial  plateau.  A small stab incision was made at the region of Gerdy's tubercle after localizing appropriate position of the planned hinged pin with fluoroscopy.  A 2.4 mm guidepin was placed such that there was 25% more distance between the hinge pin and the superior aspect of the lateral tibial plateau compared to the lateral tibia.  It was placed perpendicular to the posterior cortex of the tibia on the lateral view to maintain appropriate slope.  This pin was then overreamed with a 5 mm cannulated reamer.  The appropriate hinge pin was placed into the hole.  The Arthrex cutting guide was secured via 2 breakaway pins with the reference off of the hinge pin.  Fluoroscopy was used to confirm appropriate cut trajectory.  Utilizing the cutting guide and using appropriate retractors for protection of both the patellar tendon and posterior neurovascular structures,, a saw was used to start the osteotomy, ensuring both the anterior and posterior cortices were cut.  Next, a flexible osteotome was used to advance the cut both anteriorly and posteriorly until the hinge pin was met.  Next, another osteotome was advanced to the hinge pin such that they were stacked.  Care was taken to make sure the anterior and posterior cortices were completely cut.  Then, the guide system to place the lug holes for the implant was placed and holes were drilled appropriately.  Reaming from these holes was saved for bone grafting.  Next, an opening jack was utilized to perform the opening wedge osteotomy until desired correction  had been achieved.  Amount of correction and size of implant was based on preoperative templating.  The jack was opened an additional 2-3 degrees to allow for appropriate implant placement.  Next, Osferion bone wedges were placed both anteriorly and posteriorly after cutting down to an appropriate size.  Next, the appropriate sized implant was placed with the jack open and a tamp was used to ensure that it sat flush  with the tibial cortex.  The jack was disassembled and removed while maintaining the implant in place.  Then, cortical and cancellous screws (2 each) were placed with appropriate drilling and tapping as needed.  Fluoroscopy was used to confirm appropriate position of the implant and screws.  This nicely secured the implant.  Prior bone reamings that had been obtained were placed anteriorly.  This appropriately completed the high tibial osteotomy portion of the procedure.  Closure of the anterolateral portal with 3-0 Nylon was performed.  Anterolateral stab incisions for hinge pin was closed with 2-0 Vicryl and 3-0 nylon sutures.  The sartorius fascia was closed 0 vicryl. The subdermal layer of the anteromedial HTO incision was closed with 2-0 vicryl and the skin was closed with 4-0 Monocryl in a running fashion and Dermabond.  Xeroform gauze and dry sterile dressings were applied. A PolarCare and hinged knee brace were also applied.   Instrument, sponge, and needle counts were correct prior to wound closure and at the conclusion of the case.   Of note, assistance from a PA was essential to performing the surgery.  PA was present for the entire surgery.  PA assisted with patient positioning, retraction, instrumentation, and wound closure. The surgery would have been more difficult and had longer operative time without PA assistance.     POSTOPERATIVE PLAN: The patient will be admitted overnight for pain control and PT/OT.  Non-weight bearing x 4 weeks. 50% WB from weeks 4-6. ASA for DVT ppx x 4 weeks, The patient will be attending physical therapy beginning 3-4 days post-op. Physical therapy per High Tibial Osteotomy Rehab Guidelines.   Patient to return to clinic 10-14 days postop for suture removal.

## 2019-04-14 NOTE — H&P (Signed)
Paper H&P to be scanned into permanent record. H&P reviewed. No significant changes noted.  

## 2019-04-14 NOTE — Transfer of Care (Signed)
Immediate Anesthesia Transfer of Care Note  Patient: Albert Houston  Procedure(s) Performed: LEFT KNEE ARTHROSCOPY, CHONDROPLASTY, AND HIGH TIBIAL OSTEOTOMY (Left Knee)  Patient Location: PACU  Anesthesia Type:General  Level of Consciousness: awake  Airway & Oxygen Therapy: Patient Spontanous Breathing and Patient connected to face mask oxygen  Post-op Assessment: Report given to RN and Post -op Vital signs reviewed and stable  Post vital signs: Reviewed and stable  Last Vitals:  Vitals Value Taken Time  BP 151/83 04/14/19 1116  Temp 36.3 C 04/14/19 1115  Pulse 102 04/14/19 1120  Resp 18 04/14/19 1120  SpO2 98 % 04/14/19 1120  Vitals shown include unvalidated device data.  Last Pain:  Vitals:   04/14/19 1115  PainSc: Asleep         Complications: No apparent anesthesia complications

## 2019-04-14 NOTE — Evaluation (Signed)
Physical Therapy Evaluation Patient Details Name: Albert Houston MRN: [id number] DOB: [birth year] Today's Date: 04/14/2019   History of Present Illness  Per chart review, pt is a 44 y/o M s/p left knee: high tibial osteotomy, arthroscopic partial medial meniscectomy, and medial compartment and patella chondroplasty.    Clinical Impression  Pt pleasant and motivated to participate during the session. Pt found on RA and HR and SpO2 were WNL t/o session. Pt did not require physical assistance with bed mobility, transfers, or ambulation today and was able to adhere to NWB status of the LLE with minimal cueing. Pt required mod cueing for RW strategies and sequencing. A RW was used today and crutches may be attempted pending appropriateness during future visits per patient request. Per surgeon's recommendations, pt will benefit from OPPT services upon discharge to safely address deficits listed in patient problem list for decreased caregiver assistance and eventual return to PLOF.       Follow Up Recommendations Outpatient PT;Follow surgeon's recommendation for DC plan and follow-up therapies    Equipment Recommendations  3in1 (PT);Rolling walker with 5" wheels    Recommendations for Other Services       Precautions / Restrictions Precautions Precautions: Fall Restrictions Weight Bearing Restrictions: Yes LLE Weight Bearing: Non weight bearing      Mobility  Bed Mobility Overal bed mobility: Needs Assistance Bed Mobility: Supine to Sit     Supine to sit: Supervision     General bed mobility comments: No physical assistance needed but took several attempts to try various strategies. Management of IV line provided  Transfers Overall transfer level: Needs assistance Equipment used: Rolling walker (2 wheeled) Transfers: Sit to/from Stand Sit to Stand: Supervision         General transfer comment: No physical assistance needed. Cueing provided for  WBS  Ambulation/Gait Ambulation/Gait assistance: Min guard Gait Distance (Feet): 5 Feet Assistive device: Rolling walker (2 wheeled)   Gait velocity: decreased   General Gait Details: Hop-to pattern with NWB of the LLE. Cueing provided for sequencing with RW and for RLE placement in relation to the RW.  Stairs            Wheelchair Mobility    Modified Rankin (Stroke Patients Only)       Balance Overall balance assessment: Needs assistance Sitting-balance support: No upper extremity supported;Feet supported Sitting balance-Leahy Scale: Good     Standing balance support: Bilateral upper extremity supported;During functional activity Standing balance-Leahy Scale: Fair Standing balance comment: BUE support to maintain NWB of the LLE                             Pertinent Vitals/Pain Pain Assessment: 0-10 Pain Score: 5  Pain Location: L knee Pain Descriptors / Indicators: Aching;Sore Pain Intervention(s): Monitored during session;Premedicated before session    Home Living Family/patient expects to be discharged to:: Private residence Living Arrangements: Spouse/significant other;Children;Parent Available Help at Discharge: Family;Available 24 hours/day Type of Home: House Home Access: Stairs to enter Entrance Stairs-Rails: Right;Left;Can reach both Entrance Stairs-Number of Steps: 5 Home Layout: Two level;Able to live on main level with bedroom/bathroom Home Equipment: Crutches;Walker - 4 wheels;Grab bars - tub/shower;Shower seat      Prior Function Level of Independence: Independent         Comments: Per pt: sometimes limited by pain but able to amb community distances without an AD. Ind with ADL's, one 1 fall in past six month (related to current  hospitalization)     Hand Dominance        Extremity/Trunk Assessment        Lower Extremity Assessment Lower Extremity Assessment: Generalized weakness       Communication    Communication: No difficulties  Cognition Arousal/Alertness: Awake/alert Behavior During Therapy: WFL for tasks assessed/performed Overall Cognitive Status: Within Functional Limits for tasks assessed                                        General Comments      Exercises Total Joint Exercises Ankle Circles/Pumps: AROM;Strengthening;Both;10 reps Quad Sets: Strengthening;Both;10 reps Gluteal Sets: Strengthening;Both;10 reps Long Arc Quad: AROM;Strengthening;Both;10 reps Other Exercises Other Exercises: education on WBS Other Exercises: education of RW sequencing   Assessment/Plan    PT Assessment Patient needs continued PT services  PT Problem List Decreased strength;Decreased mobility;Decreased safety awareness;Decreased range of motion;Decreased knowledge of precautions;Decreased activity tolerance;Decreased balance;Pain       PT Treatment Interventions DME instruction;Therapeutic exercise;Gait training;Balance training;Stair training;Functional mobility training;Therapeutic activities;Patient/family education    PT Goals (Current goals can be found in the Care Plan section)  Acute Rehab PT Goals Patient Stated Goal: "go home" PT Goal Formulation: With patient Time For Goal Achievement: 04/27/19 Potential to Achieve Goals: Good    Frequency BID   Barriers to discharge        Co-evaluation               AM-PAC PT "6 Clicks" Mobility  Outcome Measure Help needed turning from your back to your side while in a flat bed without using bedrails?: A Little Help needed moving from lying on your back to sitting on the side of a flat bed without using bedrails?: A Little Help needed moving to and from a bed to a chair (including a wheelchair)?: A Little Help needed standing up from a chair using your arms (e.g., wheelchair or bedside chair)?: A Little Help needed to walk in hospital room?: A Little Help needed climbing 3-5 steps with a railing? : A Lot 6  Click Score: 17    End of Session Equipment Utilized During Treatment: Gait belt Activity Tolerance: Patient tolerated treatment well Patient left: in chair;with call bell/phone within reach;with chair alarm set;with SCD's reapplied;with family/visitor present Nurse Communication: Mobility status;Weight bearing status PT Visit Diagnosis: Unsteadiness on feet (R26.81)    Time: 6283-1517 PT Time Calculation (min) (ACUTE ONLY): 40 min   Charges:              Annabelle Harman, SPT 04/14/19 5:36 PM

## 2019-04-14 NOTE — Anesthesia Preprocedure Evaluation (Addendum)
Anesthesia Evaluation  Patient identified by MRN, date of birth, ID band Patient awake    Reviewed: Allergy & Precautions, H&P , NPO status , Patient's Chart, lab work & pertinent test results, reviewed documented beta blocker date and time   History of Anesthesia Complications Negative for: history of anesthetic complications  Airway Mallampati: II  TM Distance: >3 FB Neck ROM: full    Dental no notable dental hx. (+) Edentulous Lower, Lower Dentures, Missing, Poor Dentition   Pulmonary neg shortness of breath, asthma (mild, intermittent) , neg sleep apnea, neg COPD, neg recent URI, Current Smoker and Patient abstained from smoking., former smoker,    Pulmonary exam normal breath sounds clear to auscultation       Cardiovascular Exercise Tolerance: Good negative cardio ROS Normal cardiovascular exam Rhythm:regular Rate:Normal     Neuro/Psych  Headaches, negative psych ROS   GI/Hepatic negative GI ROS, Neg liver ROS,   Endo/Other  negative endocrine ROS  Renal/GU negative Renal ROS  negative genitourinary   Musculoskeletal   Abdominal   Peds  Hematology negative hematology ROS (+)   Anesthesia Other Findings Past Medical History:   Flu                                             03-17-15         Comment:RESOLVED NOW (03-26-15)   Headache                                                       Comment:h/o migraine   Reproductive/Obstetrics negative OB ROS                            Anesthesia Physical  Anesthesia Plan  ASA: II  Anesthesia Plan: General   Post-op Pain Management:  Regional for Post-op pain   Induction: Intravenous  PONV Risk Score and Plan:   Airway Management Planned: Oral ETT  Additional Equipment:   Intra-op Plan:   Post-operative Plan: Extubation in OR  Informed Consent: I have reviewed the patients History and Physical, chart, labs and discussed the  procedure including the risks, benefits and alternatives for the proposed anesthesia with the patient or authorized representative who has indicated his/her understanding and acceptance.     Dental Advisory Given  Plan Discussed with: Anesthesiologist, CRNA and Surgeon  Anesthesia Plan Comments: (Talked with patient about performing an adductor canal block in PACU for post op pain relief with risks discussed including nerve damage, infection, seizure, cardiac arrest, block not working among others.  Patient understands risks and wishes to proceed with the block.)       Anesthesia Quick Evaluation

## 2019-04-14 NOTE — Discharge Instructions (Signed)
Arthroscopic Knee Surgery & High Tibial Osteotomy   Post-Op Instructions   1. Bracing or crutches: Crutches will be provided at the time of discharge from the surgery center. Keep brace locked in extension at all times except as directed by physical therapy.    2. Ice: You may be provided with a device [facility]) that allows you to ice the affected area effectively. Otherwise you can ice manually.    3. Driving:  Plan on not driving for at least four weeks. Please note that you are advised NOT to drive while taking narcotic pain medications as you may be impaired and unsafe to drive.   4. Activity: Ankle pumps several times an hour while awake to prevent blood clots. Weight bearing: NO WEIGHT BEARING FOR 4 WEEKS. Use crutches for at least 4 weeks, if not 6 based on your surgery. Bending and straightening the knee is unlimited, but do not flex your knee past 90 degrees until cleared by your therapist. Elevate knee above heart level as much as possible for one week. Avoid standing more than 5 minutes (consecutively) for the first week. No exercise involving the knee until cleared by the surgeon or physical therapist.  Avoid long distance travel for 4 weeks. Use continuous passive motion (CPM) machine 6-8 hours daily as instructed starting the day after surgery.  5. Medications:  - You have been provided a prescription for narcotic pain medicine. After surgery, take 1-2 narcotic tablets every 4 hours if needed for severe pain. If it has tylenol (acetaminophen), please do not take a total of more than 3000mg /day of tylenol.  - A prescription for anti-nausea medication will be provided in case the narcotic medicine causes nausea - take 1 tablet every 6 hours only if nauseated.  - Take enteric coated aspirin 325 mg once daily for 4 weeks to prevent blood clots.  -Take tylenol 1000 every 8 hours for pain.  May stop tylenol 3 days after surgery or when you are having minimal pain. If your narcotic has  tylenol (acetaminophen), please do not take a total of more than 3000mg /day of tylenol.    If you are taking prescription medication for anxiety, depression, insomnia, muscle spasm, chronic pain, or for attention deficit disorder you are advised that you are at a higher risk of adverse effects with use of narcotics post-op, including narcotic addiction/dependence, depressed breathing, death. If you use non-prescribed substances: alcohol, marijuana, cocaine, heroin, methamphetamines, etc., you are at a higher risk of adverse effects with use of narcotics post-op, including narcotic addiction/dependence, depressed breathing, death. You are advised that taking > 50 morphine milligram equivalents (MME) of narcotic pain medication per day results in twice the risk of overdose or death. For your prescription provided: oxycodone 5 mg - taking more than 6 tablets per day. Be advised that we will prescribe narcotics short-term, for acute post-operative pain only - 1 week for minor operations such as knee arthroscopy for meniscus tear resection, and 3 weeks for major operations such as knee repair/reconstruction surgeries.   6. Bandages: The physical therapist should change the bandages at the first post-op appointment. If needed, the dressing supplies have been provided to you. You may shower after this with waterproof bandaids covering the incisions.    7. Physical Therapy: 2 times per week for the first 4 weeks, then 1-2 times per week from weeks 4-8 post-op. Therapy typically starts on post operative Day 3 or 4. You have been provided an order for physical therapy. The therapist  will provide home exercises.   8. Work: May do light duty/desk job in approximately 2 weeks when off of narcotics, pain is well-controlled, and swelling has decreased. Return to full work, especially if more labor intensive, may take approximately 3 months.    9. Post-Op Appointments: Your first post-op appointment will be with Dr.  Allena Katz in approximately 2 weeks time.    If you find that they have not been scheduled or if you have any questions about the above instructions please call the Orthopaedic Appointment front desk at [phone].

## 2019-04-15 DIAGNOSIS — M21162 Varus deformity, not elsewhere classified, left knee: Secondary | ICD-10-CM | POA: Diagnosis not present

## 2019-04-15 LAB — HIV ANTIBODY (ROUTINE TESTING W REFLEX): HIV Screen 4th Generation wRfx: NONREACTIVE

## 2019-04-15 MED ORDER — ONDANSETRON HCL 4 MG PO TABS: 4.0000 mg | ORAL_TABLET | Freq: Four times a day (QID) | ORAL | 1 refills | Status: DC | PRN

## 2019-04-15 MED ORDER — METHOCARBAMOL 500 MG PO TABS: 500.0000 mg | ORAL_TABLET | Freq: Four times a day (QID) | ORAL | 1 refills | Status: DC | PRN

## 2019-04-15 MED ORDER — ASPIRIN 325 MG PO TBEC: 325.0000 mg | DELAYED_RELEASE_TABLET | Freq: Every day | ORAL | 0 refills | Status: AC

## 2019-04-15 MED ORDER — OXYCODONE HCL 5 MG PO TABS: 5.0000 mg | ORAL_TABLET | ORAL | 0 refills | Status: DC | PRN

## 2019-04-15 NOTE — Anesthesia Postprocedure Evaluation (Signed)
Anesthesia Post Note  Patient: GERHARDT GLEED  Procedure(s) Performed: LEFT KNEE ARTHROSCOPY, CHONDROPLASTY, AND HIGH TIBIAL OSTEOTOMY (Left Knee)  Patient location during evaluation: PACU Anesthesia Type: General Level of consciousness: awake and alert and oriented Pain management: pain level controlled Vital Signs Assessment: post-procedure vital signs reviewed and stable Respiratory status: spontaneous breathing, nonlabored ventilation and respiratory function stable Cardiovascular status: blood pressure returned to baseline and stable Postop Assessment: no signs of nausea or vomiting Anesthetic complications: no     Last Vitals:  Vitals:   04/15/19 0052 04/15/19 0416  BP: (!) 152/92 (!) 148/97  Pulse: 88 95  Resp: 20 18  Temp: 36.9 C 36.6 C  SpO2: 100% 99%    Last Pain:  Vitals:   04/15/19 0423  TempSrc:   PainSc: 5                  Riki Gehring

## 2019-04-15 NOTE — Progress Notes (Signed)
Patient is ready for discharge. Writer went over discharge paperwork with patient and girlfriend both verbalized understanding and had no questions. Patient's girlfriend has all belongings packed, also patient going home with RW and BSC. Patient is stable. Nurse tech will transfer patient via [facility] to private car with girlfriend.

## 2019-04-15 NOTE — Progress Notes (Signed)
OT Cancellation Note  Patient Details Name: Albert Houston MRN: [id number] DOB: [birth year]   Cancelled Treatment:    Reason Eval/Treat Not Completed: Other (comment)   Patient sitting up eating breakfast.  Requesting therapist to return after finishing meal.  Will follow up as available and appropriate.  Thank you.  Wynn Maudlin 04/15/2019, 9:40 AM

## 2019-04-15 NOTE — Discharge Summary (Signed)
Physician Discharge Summary  Patient ID: Albert Houston MRN: [id number] DOB/AGE: [birth year] 43 y.o.  Admit date: 04/14/2019 Discharge date: 04/15/2019  Admission Diagnoses:  Genu varum [M21.169]  Left medial compartment degenerative changes.  Discharge Diagnoses: Patient Active Problem List   Diagnosis Date Noted  . Genu varum 04/14/2019  . Olecranon bursitis of left elbow 05/24/2016  . Puncture wound of ankle with complication 06/13/2014  . Ingrown toenail 06/13/2014  . Left ankle injury 06/07/2014  . Encounter to establish care 03/01/2014  . Tobacco use disorder 03/01/2014  . Abscess of skin of abdomen 03/01/2014  . Low back pain 03/01/2014    Past Medical History:  Diagnosis Date  . Flu 03-17-15   RESOLVED NOW (03-26-15)  . Headache    h/o migraine     Transfusion: None.   Consultants (if any):   Discharged Condition: Improved  Hospital Course: Albert Houston is an 44 y.o. male who was admitted 04/14/2019 with a diagnosis of genu varum of the left knee and medial compartment degenerative changes and went to the operating room on 04/14/2019 and underwent the above named procedures.    Surgeries: Procedure(s): LEFT KNEE ARTHROSCOPY, CHONDROPLASTY, AND HIGH TIBIAL OSTEOTOMY on 04/14/2019 Patient tolerated the surgery well. Taken to PACU where she was stabilized and then transferred to the orthopedic floor.  Started on Aspirin 325mg  daily. Heels elevated on bed with rolled towels. No evidence of DVT. Negative Homan. Physical therapy started on day #1 for gait training and transfer. OT started day #1 for ADL and assisted devices.  Patient's IV and foley were both removed on POD1.  Implants:  - Arthrex iBalance HTO Implant, Large 7 degree - Arthrex iBalance screws (two 6.69mm cancellous and two 4.30mm cortical) - Arthrex Osferion bone void filler - 7x3x30x88mm x2  He was given perioperative antibiotics:  Anti-infectives (From admission, onward)   Start     Dose/Rate Route  Frequency Ordered Stop   04/14/19 1400  ceFAZolin (ANCEF) 3 g in dextrose 5 % 50 mL IVPB     3 g 100 mL/hr over 30 Minutes Intravenous Every 6 hours 04/14/19 1210 04/15/19 0238   04/14/19 0600  ceFAZolin (ANCEF) 3 g in dextrose 5 % 50 mL IVPB     3 g 100 mL/hr over 30 Minutes Intravenous On call to O.R. 04/13/19 2141 04/14/19 06/14/19    .  He was given sequential compression devices, early ambulation, and Aspirin for DVT prophylaxis.  He benefited maximally from the hospital stay and there were no complications.    Recent vital signs:  Vitals:   04/15/19 0416 04/15/19 0717  BP: (!) 148/97 126/81  Pulse: 95 92  Resp: 18   Temp: 97.8 F (36.6 C) 98.4 F (36.9 C)  SpO2: 99% 98%    Recent laboratory studies:  Lab Results  Component Value Date   HGB 14.5 09/24/2017   HGB 15.5 07/08/2014   HGB 16.5 02/26/2014   Lab Results  Component Value Date   WBC 8.8 09/24/2017   PLT 225 09/24/2017   No results found for: INR Lab Results  Component Value Date   NA 138 09/24/2017   K 3.5 09/24/2017   CL 107 09/24/2017   CO2 24 09/24/2017   BUN 15 09/24/2017   CREATININE 0.83 09/24/2017   GLUCOSE 111 (H) 09/24/2017   Discharge Medications:   Allergies as of 04/15/2019   No Known Allergies     Medication List    TAKE these medications   aspirin 325  MG EC tablet Take 1 tablet (325 mg total) by mouth daily.   methocarbamol 500 MG tablet Commonly known as: ROBAXIN Take 1 tablet (500 mg total) by mouth every 6 (six) hours as needed for muscle spasms.   ondansetron 4 MG tablet Commonly known as: ZOFRAN Take 1 tablet (4 mg total) by mouth every 6 (six) hours as needed for nausea.   oxyCODONE 5 MG immediate release tablet Commonly known as: Oxy IR/ROXICODONE Take 1-2 tablets (5-10 mg total) by mouth every 4 (four) hours as needed for moderate pain or breakthrough pain.      Diagnostic Studies: DG Knee 1-2 Views Left  Result Date: 04/14/2019 CLINICAL DATA:  Tibial osteotomy.  EXAM: LEFT KNEE - 1-2 VIEW; DG C-ARM 1-60 MIN COMPARISON:  Radiographs dated 03/12/2019 FINDINGS: Multiple C-arm images demonstrate the patient undergoing proximal left tibial osteotomy. IMPRESSION: Proximal left tibial osteotomy. FLUOROSCOPY TIME:  48 seconds C-arm fluoroscopic images were obtained intraoperatively and submitted for post operative interpretation. Electronically Signed   By: Lorriane Shire M.D.   On: 04/14/2019 10:45   DG Knee Left Port  Result Date: 04/14/2019 CLINICAL DATA:  Left knee osteotomy EXAM: PORTABLE LEFT KNEE - 1-2 VIEW COMPARISON:  None. FINDINGS: Interval proximal tibial metaphyseal osteotomy with bone graft placement at the osteotomy site. Normal alignment. No acute fracture or dislocation. Small amount air within the joint space likely iatrogenic. No significant joint effusion. Mild medial femorotibial compartment joint space narrowing. Mild patellofemoral compartment joint space narrowing with tiny marginal osteophytes. No aggressive osseous lesion. Postsurgical changes in the proximal tibial soft tissues. IMPRESSION: Interval proximal tibial metaphyseal osteotomy with bone graft placement at the osteotomy site. Normal alignment. 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 Electronically Signed   By: Kathreen Devoid   On: 04/14/2019 12:07   DG C-Arm 1-60 Min  Result Date: 04/14/2019 CLINICAL DATA:  Tibial osteotomy. EXAM: LEFT KNEE - 1-2 VIEW; DG C-ARM 1-60 MIN COMPARISON:  Radiographs dated 03/12/2019 FINDINGS: Multiple C-arm images demonstrate the patient undergoing proximal left tibial osteotomy. IMPRESSION: Proximal left tibial osteotomy. FLUOROSCOPY TIME:  48 seconds C-arm fluoroscopic images were obtained intraoperatively and submitted for post operative interpretation. Electronically Signed   By: Lorriane Shire M.D.   On: 04/14/2019 10:45   DG C-Arm 1-60 Min  Result Date: 04/14/2019 CLINICAL DATA:  Tibial osteotomy. EXAM: LEFT  KNEE - 1-2 VIEW; DG C-ARM 1-60 MIN COMPARISON:  Radiographs dated 03/12/2019 FINDINGS: Multiple C-arm images demonstrate the patient undergoing proximal left tibial osteotomy. IMPRESSION: Proximal left tibial osteotomy. FLUOROSCOPY TIME:  48 seconds C-arm fluoroscopic images were obtained intraoperatively and submitted for post operative interpretation. Electronically Signed   By: Lorriane Shire M.D.   On: 04/14/2019 10:45   DG C-Arm 1-60 Min  Result Date: 04/14/2019 CLINICAL DATA:  Tibial osteotomy. EXAM: LEFT KNEE - 1-2 VIEW; DG C-ARM 1-60 MIN COMPARISON:  Radiographs dated 03/12/2019 FINDINGS: Multiple C-arm images demonstrate the patient undergoing proximal left tibial osteotomy. IMPRESSION: Proximal left tibial osteotomy. FLUOROSCOPY TIME:  48 seconds C-arm fluoroscopic images were obtained intraoperatively and submitted for post operative interpretation. Electronically Signed   By: Lorriane Shire M.D.   On: 04/14/2019 10:45   Disposition: Plan for discharge home this afternoon after morning session of PT.  Follow-up Information    Leim Fabry, MD Follow up in 14 day(s).   Specialty: Orthopedic Surgery Why: Suture Removal. Contact information: [place] [address] [phone]          Signed:  Meriel Pica PA-C 04/15/2019, 7:32 AM

## 2019-04-15 NOTE — Progress Notes (Signed)
Physical Therapy Treatment Patient Details Name: Albert Houston MRN: [id number] DOB: 10/03/1975 Today's Date: 04/15/2019    History of Present Illness Per chart review, pt is a 44 y/o M s/p left knee: high tibial osteotomy, arthroscopic partial medial meniscectomy, and medial compartment and patella chondroplasty.    PT Comments    Pt pleasant and motivated to participate during the session. Pt demonstrated good carryover and good ability with mobility with the RW. When pt attempted crutches for mobility, pt had one instance of post LOB that required min A and was generally unsteady t/o. Pt, pt's wife, and Thereasa Parkin all agreed that pt will use RW for mobility at home. For stair training, pt used one axillary crutch and one railing and demonstrated good stability, understanding, and safety.  Car transfer training was verbally reviewed and pt and pt's wife verbally expressed understanding. Per surgeon's recommendation, pt will benefit from OPPT to address his physical impairments.    Follow Up Recommendations  Outpatient PT;Follow surgeon's recommendation for DC plan and follow-up therapies     Equipment Recommendations  3in1 (PT);Rolling walker with 5" wheels    Recommendations for Other Services       Precautions / Restrictions Precautions Precautions: Fall Restrictions Weight Bearing Restrictions: Yes LLE Weight Bearing: Non weight bearing    Mobility  Bed Mobility Overal bed mobility: Needs Assistance Bed Mobility: Supine to Sit     Supine to sit: Modified independent (Device/Increase time)     General bed mobility comments: Extra time and effort  Transfers Overall transfer level: Needs assistance Equipment used: Rolling walker (2 wheeled) Transfers: Sit to/from Stand Sit to Stand: Modified independent (Device/Increase time)         General transfer comment: Extra time and effort  Ambulation/Gait Ambulation/Gait assistance: Min guard Gait Distance (Feet): 25  Feet(x2) Assistive device: Rolling walker (2 wheeled);Crutches   Gait velocity: decreased   General Gait Details: For first 25 feet, pt used a RW and demonstarted good carryover with sequencing and foot placement. For second 25 feet, pt used axillary crutches and started with a swing-to pattern and progressed to a swing-through pattern. When using curtches, pt had several instances of instability and on one occasion, had a posterior LOB that required min A.   Stairs Stairs: Yes Stairs assistance: Min guard Stair Management: With crutches;One rail Left;One rail Right Number of Stairs: 4 General stair comments: Pt went up the stairs with a crutch on the L side and using the R railing. To descend the stairs, pt used the crutch on the left side and used the opposite railing. Pt demonstarted good understanding of strategies and sequencing. Spouse educated on guarding position.   Wheelchair Mobility    Modified Rankin (Stroke Patients Only)       Balance Overall balance assessment: Needs assistance Sitting-balance support: No upper extremity supported;Feet unsupported Sitting balance-Leahy Scale: Good     Standing balance support: Bilateral upper extremity supported;During functional activity Standing balance-Leahy Scale: Fair Standing balance comment: BUE support to maintain NWB of the LLE                            Cognition Arousal/Alertness: Awake/alert Behavior During Therapy: WFL for tasks assessed/performed Overall Cognitive Status: Within Functional Limits for tasks assessed  Exercises Total Joint Exercises Ankle Circles/Pumps: AROM;Strengthening;Both;10 reps Quad Sets: Strengthening;Both;10 reps Gluteal Sets: Strengthening;Both;10 reps Other Exercises Other Exercises: Crutch training on level ground and stairs Other Exercises: education of RW sequencing Other Exercises: verbal and visual simulation of  car transfer sequencing    General Comments        Pertinent Vitals/Pain Pain Assessment: 0-10 Pain Score: 6  Pain Descriptors / Indicators: Aching;Sore Pain Intervention(s): Monitored during session;Premedicated before session    Home Living                      Prior Function            PT Goals (current goals can now be found in the care plan section) Progress towards PT goals: Progressing toward goals    Frequency    BID      PT Plan Current plan remains appropriate    Co-evaluation              AM-PAC PT "6 Clicks" Mobility   Outcome Measure  Help needed turning from your back to your side while in a flat bed without using bedrails?: None Help needed moving from lying on your back to sitting on the side of a flat bed without using bedrails?: None Help needed moving to and from a bed to a chair (including a wheelchair)?: A Little Help needed standing up from a chair using your arms (e.g., wheelchair or bedside chair)?: A Little Help needed to walk in hospital room?: A Little Help needed climbing 3-5 steps with a railing? : A Little 6 Click Score: 20    End of Session Equipment Utilized During Treatment: Gait belt Activity Tolerance: Patient tolerated treatment well Patient left: in bed;with bed alarm set;with call bell/phone within reach;with family/visitor present;with SCD's reapplied Nurse Communication: Mobility status;Weight bearing status PT Visit Diagnosis: Unsteadiness on feet (R26.81);Other abnormalities of gait and mobility (R26.89)     Time: 1000-1039 PT Time Calculation (min) (ACUTE ONLY): 39 min  Charges:                        Annabelle Harman, SPT 04/15/19 11:59 AM

## 2019-04-15 NOTE — Progress Notes (Signed)
Subjective: 1 Day Post-Op Procedure(s) (LRB): LEFT KNEE ARTHROSCOPY, CHONDROPLASTY, AND HIGH TIBIAL OSTEOTOMY (Left) Patient reports pain as moderate.   Patient is well, and has had no acute complaints or problems Plan is to go Home after hospital stay. Negative for chest pain and shortness of breath Fever: no Gastrointestinal:Negative for nausea and vomiting  Objective: Vital signs in last 24 hours: Temp:  [97.3 F (36.3 C)-98.4 F (36.9 C)] 98.4 F (36.9 C) (04/06 0717) Pulse Rate:  [84-103] 92 (04/06 0717) Resp:  [9-20] 18 (04/06 0416) BP: (126-161)/(76-101) 126/81 (04/06 0717) SpO2:  [92 %-100 %] 98 % (04/06 0717)  Intake/Output from previous day:  Intake/Output Summary (Last 24 hours) at 04/15/2019 0724 Last data filed at 04/15/2019 0559 Gross per 24 hour  Intake 3086.28 ml  Output 2650 ml  Net 436.28 ml    Intake/Output this shift: No intake/output data recorded.  Labs: No results for input(s): HGB in the last 72 hours. No results for input(s): WBC, RBC, HCT, PLT in the last 72 hours. No results for input(s): NA, K, CL, CO2, BUN, CREATININE, GLUCOSE, CALCIUM in the last 72 hours. No results for input(s): LABPT, INR in the last 72 hours.   EXAM General - Patient is Alert, Appropriate and Oriented Extremity - ABD soft Sensation intact distally Intact pulses distally Dorsiflexion/Plantar flexion intact Incision: dressing C/D/I No cellulitis present Dressing/Incision - clean, dry, no drainage, Knee ROM brace intact with polar care unit.  No drainage noted. Motor Function - intact, moving foot and toes well on exam.  Abdomen soft with normal BS. Negative Homan's to the left leg.  Past Medical History:  Diagnosis Date  . Flu 03-17-15   RESOLVED NOW (03-26-15)  . Headache    h/o migraine   Assessment/Plan: 1 Day Post-Op Procedure(s) (LRB): LEFT KNEE ARTHROSCOPY, CHONDROPLASTY, AND HIGH TIBIAL OSTEOTOMY (Left) Active Problems:   Genu varum  Estimated body mass  index is 34.5 kg/m as calculated from the following:   Height as of 04/04/19: 6\' 3"  (1.905 m).   Weight as of this encounter: 125.2 kg. Advance diet Up with therapy D/C IV fluids when tolerating po intake.  Plan for therapy session this AM. Will work with either crutches or walker and see which one he does better with for home discharge. Remain non-weightbearing to the left leg. Plan for discharge home after morning session of PT.  DVT Prophylaxis - Aspirin  , PA-C [facility] 04/15/2019, 7:24 AM

## 2019-04-15 NOTE — TOC Transition Note (Signed)
Transition of Care [facility]) - CM/SW Discharge Note   Patient Details  Name: Albert Houston MRN: [id number] Date of Birth: 05-09-1975  Transition of Care [facility]) CM/SW Contact:  Barrie Dunker, RN Phone Number: 04/15/2019, 9:05 AM   Clinical Narrative:     Patient to discharge home today with his wife, he will get a RW and a 3 in 1 from [employer] to take home, Bread to bring to the room.  His wife provides transportation, he can afford his medications and is up to date with his PCP, no additional needs  Final next level of care: OP Rehab Barriers to Discharge: Barriers Resolved   Patient Goals and CMS Choice Patient states their goals for this hospitalization and ongoing recovery are:: go home      Discharge Placement                       Discharge Plan and Services   Discharge Planning Services: CM Consult            DME Arranged: 3-N-1, Walker rolling DME Agency: [employer] Date DME Agency Contacted: 04/15/19 Time DME Agency Contacted: [phone] Representative spoke with at DME Agency: Nida Boatman HH Arranged: NA          Social Determinants of Health (SDOH) Interventions     Readmission Risk Interventions No flowsheet data found.

## 2019-05-28 ENCOUNTER — Other Ambulatory Visit: Payer: Self-pay | Admitting: Orthopedic Surgery

## 2019-05-28 ENCOUNTER — Ambulatory Visit
Admission: RE | Admit: 2019-05-28 | Discharge: 2019-05-28 | Disposition: A | Discharge status: Home / Self Care | Payer: BC Managed Care – PPO | Source: Ambulatory Visit | Attending: Orthopedic Surgery | Admitting: Orthopedic Surgery

## 2019-05-28 DIAGNOSIS — Z9889 Other specified postprocedural states: Secondary | ICD-10-CM

## 2019-05-28 NOTE — Progress Notes (Signed)
Bilateral standing hip to ankle x-rays on one cassette for evaluation of alignment 

## 2019-06-18 ENCOUNTER — Other Ambulatory Visit
Admission: RE | Admit: 2019-06-18 | Discharge: 2019-06-18 | Disposition: A | Discharge status: Home / Self Care | Payer: BC Managed Care – PPO | Source: Ambulatory Visit | Attending: Podiatry | Admitting: Podiatry

## 2019-06-18 DIAGNOSIS — M659 Synovitis and tenosynovitis, unspecified: Secondary | ICD-10-CM | POA: Diagnosis not present

## 2019-06-18 LAB — SYNOVIAL CELL COUNT + DIFF, W/ CRYSTALS
Crystals, Fluid: NONE SEEN
Eosinophils-Synovial: UNDETERMINED %
Lymphocytes-Synovial Fld: UNDETERMINED %
Monocyte-Macrophage-Synovial Fluid: UNDETERMINED %
Neutrophil, Synovial: UNDETERMINED %
WBC, Synovial: UNDETERMINED /mm3 (ref 0–200)

## 2019-07-08 ENCOUNTER — Other Ambulatory Visit: Payer: Self-pay

## 2019-07-08 ENCOUNTER — Emergency Department: Payer: BC Managed Care – PPO

## 2019-07-08 ENCOUNTER — Emergency Department
Admission: EM | Admit: 2019-07-08 | Discharge: 2019-07-08 | Disposition: A | Discharge status: Home / Self Care | Payer: BC Managed Care – PPO | Attending: Emergency Medicine | Admitting: Emergency Medicine

## 2019-07-08 DIAGNOSIS — Z87891 Personal history of nicotine dependence: Secondary | ICD-10-CM | POA: Diagnosis not present

## 2019-07-08 DIAGNOSIS — R109 Unspecified abdominal pain: Secondary | ICD-10-CM | POA: Diagnosis present

## 2019-07-08 DIAGNOSIS — Z7982 Long term (current) use of aspirin: Secondary | ICD-10-CM | POA: Insufficient documentation

## 2019-07-08 DIAGNOSIS — N200 Calculus of kidney: Secondary | ICD-10-CM

## 2019-07-08 LAB — CBC WITH DIFFERENTIAL/PLATELET
Abs Immature Granulocytes: 0.05 10*3/uL (ref 0.00–0.07)
Basophils Absolute: 0 10*3/uL (ref 0.0–0.1)
Basophils Relative: 0 %
Eosinophils Absolute: 0 10*3/uL (ref 0.0–0.5)
Eosinophils Relative: 0 %
HCT: 46.5 % (ref 39.0–52.0)
Hemoglobin: 14.9 g/dL (ref 13.0–17.0)
Immature Granulocytes: 0 %
Lymphocytes Relative: 12 %
Lymphs Abs: 1.6 10*3/uL (ref 0.7–4.0)
MCH: 29 pg (ref 26.0–34.0)
MCHC: 32 g/dL (ref 30.0–36.0)
MCV: 90.5 fL (ref 80.0–100.0)
Monocytes Absolute: 0.7 10*3/uL (ref 0.1–1.0)
Monocytes Relative: 6 %
Neutro Abs: 10.3 10*3/uL — ABNORMAL HIGH (ref 1.7–7.7)
Neutrophils Relative %: 82 %
Platelets: 201 10*3/uL (ref 150–400)
RBC: 5.14 MIL/uL (ref 4.22–5.81)
RDW: 15 % (ref 11.5–15.5)
WBC: 12.7 10*3/uL — ABNORMAL HIGH (ref 4.0–10.5)
nRBC: 0 % (ref 0.0–0.2)

## 2019-07-08 LAB — COMPREHENSIVE METABOLIC PANEL
ALT: 40 U/L (ref 0–44)
AST: 26 U/L (ref 15–41)
Albumin: 4.3 g/dL (ref 3.5–5.0)
Alkaline Phosphatase: 185 U/L — ABNORMAL HIGH (ref 38–126)
Anion gap: 8 (ref 5–15)
BUN: 18 mg/dL (ref 6–20)
CO2: 29 mmol/L (ref 22–32)
Calcium: 9.2 mg/dL (ref 8.9–10.3)
Chloride: 102 mmol/L (ref 98–111)
Creatinine, Ser: 1.22 mg/dL (ref 0.61–1.24)
GFR calc Af Amer: >60 mL/min (ref >60)
GFR calc non Af Amer: >60 mL/min (ref >60)
Glucose, Bld: 107 mg/dL — ABNORMAL HIGH (ref 70–99)
Potassium: 4.2 mmol/L (ref 3.5–5.1)
Sodium: 139 mmol/L (ref 135–145)
Total Bilirubin: 0.8 mg/dL (ref 0.3–1.2)
Total Protein: 7.6 g/dL (ref 6.5–8.1)

## 2019-07-08 LAB — URINALYSIS, COMPLETE (UACMP) WITH MICROSCOPIC
Bacteria, UA: NONE SEEN
Bilirubin Urine: NEGATIVE
Glucose, UA: NEGATIVE mg/dL
Ketones, ur: NEGATIVE mg/dL
Leukocytes,Ua: NEGATIVE
Nitrite: NEGATIVE
Protein, ur: NEGATIVE mg/dL
Specific Gravity, Urine: 1.024 (ref 1.005–1.030)
Squamous Epithelial / HPF: NONE SEEN (ref 0–5)
pH: 6 (ref 5.0–8.0)

## 2019-07-08 MED ORDER — ONDANSETRON HCL 4 MG/2ML IJ SOLN
4.0000 mg | Freq: Once | INTRAMUSCULAR | Status: AC
  Administered 2019-07-08: 4 mg via INTRAVENOUS
  Filled 2019-07-08: qty 2

## 2019-07-08 MED ORDER — SODIUM CHLORIDE 0.9 % IV BOLUS
1000.0000 mL | Freq: Once | INTRAVENOUS | Status: AC
  Administered 2019-07-08: 1000 mL via INTRAVENOUS

## 2019-07-08 MED ORDER — TAMSULOSIN HCL 0.4 MG PO CAPS: 0.4000 mg | ORAL_CAPSULE | Freq: Every day | ORAL | 0 refills | Status: AC

## 2019-07-08 MED ORDER — HYDROMORPHONE HCL 1 MG/ML IJ SOLN
1.0000 mg | Freq: Once | INTRAMUSCULAR | Status: AC
  Administered 2019-07-08: 1 mg via INTRAVENOUS
  Filled 2019-07-08: qty 1

## 2019-07-08 MED ORDER — KETOROLAC TROMETHAMINE 30 MG/ML IJ SOLN
15.0000 mg | Freq: Once | INTRAMUSCULAR | Status: AC
  Administered 2019-07-08: 15 mg via INTRAVENOUS
  Filled 2019-07-08: qty 1

## 2019-07-08 MED ORDER — ONDANSETRON 4 MG PO TBDP: 4.0000 mg | ORAL_TABLET | Freq: Three times a day (TID) | ORAL | 0 refills | Status: DC | PRN

## 2019-07-08 MED ORDER — OXYCODONE-ACETAMINOPHEN 5-325 MG PO TABS
2.0000 | ORAL_TABLET | Freq: Once | ORAL | Status: AC
  Administered 2019-07-08: 2 via ORAL
  Filled 2019-07-08: qty 2

## 2019-07-08 MED ORDER — TAMSULOSIN HCL 0.4 MG PO CAPS
0.4000 mg | ORAL_CAPSULE | Freq: Once | ORAL | Status: AC
  Administered 2019-07-08: 0.4 mg via ORAL
  Filled 2019-07-08: qty 1

## 2019-07-08 MED ORDER — OXYCODONE-ACETAMINOPHEN 5-325 MG PO TABS: 1.0000 | ORAL_TABLET | Freq: Four times a day (QID) | ORAL | 0 refills | Status: DC | PRN

## 2019-07-08 NOTE — ED Provider Notes (Signed)
[facility] Emergency Department Provider Note  ____________________________________________   First MD Initiated Contact with Patient 07/08/19 1616     (approximate)  I have reviewed the triage vital signs and the nursing notes.   HISTORY  Chief Complaint Flank Pain    HPI Albert Houston is a 44 y.o. male  Here with r flank pain. Pain began acutely 2 days ago, has been initially intermittent and now severe since then. Pain is aching, gnawing, worse w/ some position changes. He has had associated nausea, vomiting today. Pain has become unbearable and he was unable to finish work today. No overt dysuria or hematuria. No h/o kidney stones. No h/o prior abd surgeries. No fever, chills. No other complaints. No alleviating factors noted.        Past Medical History:  Diagnosis Date  . Flu 03-17-15   RESOLVED NOW (03-26-15)  . Headache    h/o migraine    Patient Active Problem List   Diagnosis Date Noted  . Genu varum 04/14/2019  . Olecranon bursitis of left elbow 05/24/2016  . Puncture wound of ankle with complication 06/13/2014  . Ingrown toenail 06/13/2014  . Left ankle injury 06/07/2014  . Encounter to establish care 03/01/2014  . Tobacco use disorder 03/01/2014  . Abscess of skin of abdomen 03/01/2014  . Low back pain 03/01/2014    Past Surgical History:  Procedure Laterality Date  . HERNIA REPAIR    . INGUINAL HERNIA REPAIR Left 04/01/2015   Procedure: LAPAROSCOPIC INGUINAL HERNIA;  Surgeon: Kieth BrightlySeeplaputhur G Sankar, MD;  Location: [facility] ORS;  Service: General;  Laterality: Left;  . KNEE ARTHROSCOPY Left 04/14/2019   Procedure: LEFT KNEE ARTHROSCOPY, CHONDROPLASTY, AND HIGH TIBIAL OSTEOTOMY;  Surgeon: Signa KellPatel, Sunny, MD;  Location: [facility] ORS;  Service: Orthopedics;  Laterality: Left;  Marland Kitchen. MANDIBLE FRACTURE SURGERY  2005  . TYMPANOSTOMY TUBE PLACEMENT     As a Child  . UMBILICAL HERNIA REPAIR N/A 04/01/2015   Procedure: laproscopic umbilical hernia  repair;  Surgeon: Kieth BrightlySeeplaputhur G Sankar, MD;  Location: [facility] ORS;  Service: General;  Laterality: N/A;    Prior to Admission medications   Medication Sig Start Date End Date Taking? Authorizing Provider  aspirin EC 325 MG EC tablet Take 1 tablet (325 mg total) by mouth daily. 04/15/19   Anson OregonMcGhee, Gay Lance, PA-C  methocarbamol (ROBAXIN) 500 MG tablet Take 1 tablet (500 mg total) by mouth every 6 (six) hours as needed for muscle spasms. 04/15/19   Anson OregonMcGhee, Darshawn Lance, PA-C  ondansetron (ZOFRAN ODT) 4 MG disintegrating tablet Take 1 tablet (4 mg total) by mouth every 8 (eight) hours as needed for nausea or vomiting. 07/08/19   Shaune PollackIsaacs, Elianis Fischbach, MD  ondansetron (ZOFRAN) 4 MG tablet Take 1 tablet (4 mg total) by mouth every 6 (six) hours as needed for nausea. 04/15/19   Anson OregonMcGhee, Sampson Lance, PA-C  oxyCODONE (OXY IR/ROXICODONE) 5 MG immediate release tablet Take 1-2 tablets (5-10 mg total) by mouth every 4 (four) hours as needed for moderate pain or breakthrough pain. 04/15/19   Anson OregonMcGhee, Leavy Lance, PA-C  oxyCODONE-acetaminophen (PERCOCET) 5-325 MG tablet Take 1-2 tablets by mouth every 6 (six) hours as needed for moderate pain or severe pain. 07/08/19 07/07/20  Shaune PollackIsaacs, Keya Wynes, MD  tamsulosin (FLOMAX) 0.4 MG CAPS capsule Take 1 capsule (0.4 mg total) by mouth daily for 7 days. 07/08/19 07/15/19  Shaune PollackIsaacs, Aya Geisel, MD    Allergies Patient has no known allergies.  Family History  Problem Relation Age of Onset  .  Arthritis Maternal Grandmother   . Hypertension Maternal Grandmother   . Diabetes Paternal Grandfather     Social History Social History   Tobacco Use  . Smoking status: Former Smoker    Packs/day: 1.50    Years: 12.00    Pack years: 18.00    Types: Cigarettes    Quit date: 01/09/2017    Years since quitting: 2.4  . Smokeless tobacco: Never Used  . Tobacco comment: using patch currently but if he forgets patch he will smoke   Vaping Use  . Vaping Use: Every day  . Substances: Flavoring    Substance Use Topics  . Alcohol use: Yes    Alcohol/week: 1.0 standard drink    Types: 1 Cans of beer per week    Comment: rare  . Drug use: No    Review of Systems  Review of Systems  Constitutional: Positive for fatigue. Negative for chills and fever.  HENT: Negative for sore throat.   Respiratory: Negative for shortness of breath.   Cardiovascular: Negative for chest pain.  Gastrointestinal: Positive for abdominal pain and nausea.  Genitourinary: Positive for flank pain and hematuria.  Musculoskeletal: Negative for neck pain.  Skin: Negative for rash and wound.  Allergic/Immunologic: Negative for immunocompromised state.  Neurological: Negative for weakness and numbness.  Hematological: Does not bruise/bleed easily.  All other systems reviewed and are negative.    ____________________________________________  PHYSICAL EXAM:      VITAL SIGNS: ED Triage Vitals  Enc Vitals Group     BP 07/08/19 1612 (!) 158/96     Pulse Rate 07/08/19 1612 89     Resp 07/08/19 1612 16     Temp 07/08/19 1612 98.1 F (36.7 C)     Temp Source 07/08/19 1612 Oral     SpO2 07/08/19 1612 97 %     Weight 07/08/19 1612 299 lb (135.6 kg)     Height 07/08/19 1612 6\' 3"  (1.905 m)     Head Circumference --      Peak Flow --      Pain Score 07/08/19 1610 9     Pain Loc --      Pain Edu? --      Excl. in GC? --      Physical Exam Vitals and nursing note reviewed.  Constitutional:      General: He is not in acute distress.    Appearance: He is well-developed.  HENT:     Head: Normocephalic and atraumatic.  Eyes:     Conjunctiva/sclera: Conjunctivae normal.  Cardiovascular:     Rate and Rhythm: Normal rate and regular rhythm.     Heart sounds: Normal heart sounds. No murmur heard.  No friction rub.  Pulmonary:     Effort: Pulmonary effort is normal. No respiratory distress.     Breath sounds: Normal breath sounds. No wheezing or rales.  Abdominal:     General: There is no distension.      Palpations: Abdomen is soft.     Tenderness: There is abdominal tenderness.     Comments: Moderate diffuse R flank and RLQ ttp  Musculoskeletal:     Cervical back: Neck supple.  Skin:    General: Skin is warm.     Capillary Refill: Capillary refill takes less than 2 seconds.  Neurological:     Mental Status: He is alert and oriented to person, place, and time.     Motor: No abnormal muscle tone.       ____________________________________________  LABS (all labs ordered are listed, but only abnormal results are displayed)  Labs Reviewed  CBC WITH DIFFERENTIAL/PLATELET - Abnormal; Notable for the following components:      Result Value   WBC 12.7 (*)    Neutro Abs 10.3 (*)    All other components within normal limits  URINALYSIS, COMPLETE (UACMP) WITH MICROSCOPIC - Abnormal; Notable for the following components:   Color, Urine YELLOW (*)    APPearance HAZY (*)    Hgb urine dipstick LARGE (*)    All other components within normal limits  COMPREHENSIVE METABOLIC PANEL - Abnormal; Notable for the following components:   Glucose, Bld 107 (*)    Alkaline Phosphatase 185 (*)    All other components within normal limits    ____________________________________________  EKG: None ________________________________________  RADIOLOGY All imaging, including plain films, CT scans, and ultrasounds, independently reviewed by me, and interpretations confirmed via formal radiology reads.  ED MD interpretation:   CT Stone: R sided hydro from 2-3 mm stone, o/w largely unremarkable  Official radiology report(s): CT Renal Stone Study  Result Date: 07/08/2019 CLINICAL DATA:  Flank pain.  Right-sided flank pain. EXAM: CT ABDOMEN AND PELVIS WITHOUT CONTRAST TECHNIQUE: Multidetector CT imaging of the abdomen and pelvis was performed following the standard protocol without IV contrast. COMPARISON:  None. FINDINGS: Lower chest: The lung bases are clear. The heart size is normal.  Hepatobiliary: There is mild Paddock steatosis. Normal gallbladder.There is no biliary ductal dilation. Pancreas: Normal contours without ductal dilatation. No peripancreatic fluid collection. Spleen: There is splenomegaly with the spleen measuring approximately 13 cm craniocaudad. Adrenals/Urinary Tract: --Adrenal glands: Unremarkable. --Right kidney/ureter: There is mild-to-moderate right-sided hydronephrosis secondary to an obstructing 2-3 mm stone at the right UVJ. There are additional nonobstructing 2 mm stones in the right kidney. --Left kidney/ureter: No hydronephrosis or radiopaque kidney stones. --Urinary bladder: Unremarkable. Stomach/Bowel: --Stomach/Duodenum: No hiatal hernia or other gastric abnormality. Normal duodenal course and caliber. --Small bowel: Unremarkable. --Colon: Unremarkable. --Appendix: Normal. Vascular/Lymphatic: Atherosclerotic calcification is present within the non-aneurysmal abdominal aorta, without hemodynamically significant stenosis. --No retroperitoneal lymphadenopathy. --No mesenteric lymphadenopathy. --No pelvic or inguinal lymphadenopathy. Reproductive: Unremarkable Other: No ascites or free air. There are fat containing bilateral inguinal hernias. Musculoskeletal. No acute displaced fractures. IMPRESSION: 1. Right-sided hydronephrosis secondary to an obstructing 2-3 mm stone at the right UVJ. 2. Additional nonobstructing 2 mm stones in the right kidney. 3. Hepatic steatosis. 4. Splenomegaly. 5. Fat containing bilateral inguinal hernias. Aortic Atherosclerosis (ICD10-I70.0). Electronically Signed   By: Katherine Mantle M.D.   On: 07/08/2019 17:30    ____________________________________________  PROCEDURES   Procedure(s) performed (including Critical Care):  Procedures  ____________________________________________  INITIAL IMPRESSION / MDM / ASSESSMENT AND PLAN / ED COURSE  As part of my medical decision making, I reviewed the following data within the  electronic MEDICAL RECORD NUMBER Nursing notes reviewed and incorporated, Old chart reviewed, Notes from prior ED visits, and [place] Controlled Substance Database       *Albert Houston was evaluated in Emergency Department on 07/08/2019 for the symptoms described in the history of present illness. He was evaluated in the context of the global COVID-19 pandemic, which necessitated consideration that the patient might be at risk for infection with the SARS-CoV-2 virus that causes COVID-19. Institutional protocols and algorithms that pertain to the evaluation of patients at risk for COVID-19 are in a state of rapid change based on information released by regulatory bodies including the CDC and federal and state organizations. These  policies and algorithms were followed during the patient's care in the ED.  Some ED evaluations and interventions may be delayed as a result of limited staffing during the pandemic.*     Medical Decision Making:  44 yo M here with R flank pain, nausea, vomiting. History, exam, and CT scan is c/w obstructing R UVJ stone. He has a mild reactive leukocytosis but no fever, no tachycardia, no fever/chills or other sx of infection. No pyuria. His Cr is at baseline. He feels markedly improved w/ fluids, analgesics and is tolerating PO. Will dc with flomax, analgesics, and outpt Urology f/u as needed. Encouraged hydration.  ____________________________________________  FINAL CLINICAL IMPRESSION(S) / ED DIAGNOSES  Final diagnoses:  Renal stone     MEDICATIONS GIVEN DURING THIS VISIT:  Medications  HYDROmorphone (DILAUDID) injection 1 mg (1 mg Intravenous Given 07/08/19 1743)  ondansetron (ZOFRAN) injection 4 mg (4 mg Intravenous Given 07/08/19 1741)  sodium chloride 0.9 % bolus 1,000 mL (0 mLs Intravenous Stopped 07/08/19 2001)  ketorolac (TORADOL) 30 MG/ML injection 15 mg (15 mg Intravenous Given 07/08/19 1742)  tamsulosin (FLOMAX) capsule 0.4 mg (0.4 mg Oral Given 07/08/19 1820)    HYDROmorphone (DILAUDID) injection 1 mg (1 mg Intravenous Given 07/08/19 1848)  ketorolac (TORADOL) 30 MG/ML injection 15 mg (15 mg Intravenous Given 07/08/19 1848)  oxyCODONE-acetaminophen (PERCOCET/ROXICET) 5-325 MG per tablet 2 tablet (2 tablets Oral Given 07/08/19 2013)     ED Discharge Orders         Ordered    oxyCODONE-acetaminophen (PERCOCET) 5-325 MG tablet  Every 6 hours PRN     Discontinue  Reprint     07/08/19 2029    ondansetron (ZOFRAN ODT) 4 MG disintegrating tablet  Every 8 hours PRN     Discontinue  Reprint     07/08/19 2029    tamsulosin (FLOMAX) 0.4 MG CAPS capsule  Daily     Discontinue  Reprint     07/08/19 2029           Note:  This document was prepared using Dragon voice recognition software and may include unintentional dictation errors.   Shaune Pollack, MD 07/08/19 2049

## 2019-07-08 NOTE — ED Notes (Signed)
Pt given ice chips for PO challenge °

## 2019-07-08 NOTE — ED Triage Notes (Signed)
Pt to the er for right side flank pain. Pt states it started Sunday but has gotten worse. Pain in the right side of the back, right flank, nausea, vomiting.

## 2019-07-22 NOTE — Progress Notes (Signed)
07/23/2019 11:53 AM   Albert Houston [birth year] [id number]  Referring provider: No referring provider defined for this encounter. Chief Complaint  Patient presents with  . Nephrolithiasis    HPI: Albert Houston is a 44 y.o. male who presents today for evaluation of a renal stone.  Patient was presented to the ED with right flank pain on 07/08/2019. Pain began acutely 2 days prior, had been initially intermittent and was now severe. Pain was aching, gnawing, worse w/ some position changes. He had associated nausea, vomiting day of visit. Pain had become unbearable and he was unable to finish work the day of the visit. No overt dysuria or hematuria. No alleviating factors noted.  UA showed gross hematuria.   CT renal stone study on 07/08/2019 showed a right-sided hydronephrosis secondary to an obstructing 2-3 mm stone at the right UVJ. Additional nonobstructing 2 mm stones in the right kidney. Hepatic steatosis. Splenomegaly.  No h/o kidney stones. No h/o prior abd surgeries.   He has had no pain since last week Thursday.  He has not been straining his urine consistently.  Denies dysuria.   He used to drink a lot of rockstar energy and tea drinks.    PMH: Past Medical History:  Diagnosis Date  . Flu 03-17-15   RESOLVED NOW (03-26-15)  . Headache    h/o migraine    Surgical History: Past Surgical History:  Procedure Laterality Date  . HERNIA REPAIR    . INGUINAL HERNIA REPAIR Left 04/01/2015   Procedure: LAPAROSCOPIC INGUINAL HERNIA;  Surgeon: Kieth Brightly, MD;  Location: [facility] ORS;  Service: General;  Laterality: Left;  . KNEE ARTHROSCOPY Left 04/14/2019   Procedure: LEFT KNEE ARTHROSCOPY, CHONDROPLASTY, AND HIGH TIBIAL OSTEOTOMY;  Surgeon: Signa Kell, MD;  Location: [facility] OR[facility];  Service: Orthopedics;  Laterality: Left;  Marland Kitchen MANDIBLE FRACTURE SURGERY  2005  . TYMPANOSTOMY TUBE PLACEMENT     As a Child  . UMBILICAL HERNIA REPAIR N/A 04/01/2015   Procedure:  laproscopic umbilical hernia repair;  Surgeon: Kieth Brightly, MD;  Location: [facility] ORS;  Service: General;  Laterality: N/A;    Home Medications:  Allergies as of 07/23/2019   No Known Allergies     Medication List       Accurate as of July 23, 2019 11:53 AM. If you have any questions, ask your nurse or doctor.        STOP taking these medications   ibuprofen 800 MG tablet Commonly known as: ADVIL Stopped by: Vanna Scotland, MD   methocarbamol 500 MG tablet Commonly known as: ROBAXIN Stopped by: Vanna Scotland, MD   ondansetron 4 MG tablet Commonly known as: ZOFRAN Stopped by: Vanna Scotland, MD   oxyCODONE 5 MG immediate release tablet Commonly known as: Oxy IR/ROXICODONE Stopped by: Vanna Scotland, MD   oxyCODONE-acetaminophen 5-325 MG tablet Commonly known as: Percocet Stopped by: Vanna Scotland, MD     TAKE these medications   aspirin 325 MG EC tablet Take 1 tablet (325 mg total) by mouth daily.   naproxen 500 MG tablet Commonly known as: NAPROSYN Take 500 mg by mouth 2 (two) times daily.   ondansetron 4 MG disintegrating tablet Commonly known as: Zofran ODT Take 1 tablet (4 mg total) by mouth every 8 (eight) hours as needed for nausea or vomiting.       Allergies: No Known Allergies  Family History: Family History  Problem Relation Age of Onset  . Arthritis Maternal Grandmother   . Hypertension Maternal  Grandmother   . Diabetes Paternal Grandfather     Social History:  reports that he quit smoking about 2 years ago. His smoking use included cigarettes. He has a 18.00 pack-year smoking history. He has never used smokeless tobacco. He reports current alcohol use of about 1.0 standard drink of alcohol per week. He reports that he does not use drugs.   Physical Exam: BP (!) 167/89   Pulse (!) 103   Ht 6' 3.5" (1.918 m)   Wt 297 lb (134.7 kg)   BMI 36.63 kg/m   Constitutional:  Alert and oriented, No acute distress. HEENT: [place] AT, moist  mucus membranes.  Trachea midline, no masses. Cardiovascular: No clubbing, cyanosis, or edema. Respiratory: Normal respiratory effort, no increased work of breathing. Skin: No rashes, bruises or suspicious lesions. Neurologic: Grossly intact, no focal deficits, moving all 4 extremities. Psychiatric: Normal mood and affect.  Laboratory Data:  Lab Results  Component Value Date   CREATININE 1.22 07/08/2019    Urinalysis Uremarkable  Pertinent Imaging:  Results for orders placed during the hospital encounter of 07/08/19  CT Renal Stone Study  Narrative CLINICAL DATA:  Flank pain.  Right-sided flank pain.  EXAM: CT ABDOMEN AND PELVIS WITHOUT CONTRAST  TECHNIQUE: Multidetector CT imaging of the abdomen and pelvis was performed following the standard protocol without IV contrast.  COMPARISON:  None.  FINDINGS: Lower chest: The lung bases are clear. The heart size is normal.  Hepatobiliary: There is mild Paddock steatosis. Normal gallbladder.There is no biliary ductal dilation.  Pancreas: Normal contours without ductal dilatation. No peripancreatic fluid collection.  Spleen: There is splenomegaly with the spleen measuring approximately 13 cm craniocaudad.  Adrenals/Urinary Tract:  --Adrenal glands: Unremarkable.  --Right kidney/ureter: There is mild-to-moderate right-sided hydronephrosis secondary to an obstructing 2-3 mm stone at the right UVJ. There are additional nonobstructing 2 mm stones in the right kidney.  --Left kidney/ureter: No hydronephrosis or radiopaque kidney stones.  --Urinary bladder: Unremarkable.  Stomach/Bowel:  --Stomach/Duodenum: No hiatal hernia or other gastric abnormality. Normal duodenal course and caliber.  --Small bowel: Unremarkable.  --Colon: Unremarkable.  --Appendix: Normal.  Vascular/Lymphatic: Atherosclerotic calcification is present within the non-aneurysmal abdominal aorta, without hemodynamically significant  stenosis.  --No retroperitoneal lymphadenopathy.  --No mesenteric lymphadenopathy.  --No pelvic or inguinal lymphadenopathy.  Reproductive: Unremarkable  Other: No ascites or free air. There are fat containing bilateral inguinal hernias.  Musculoskeletal. No acute displaced fractures.  IMPRESSION: 1. Right-sided hydronephrosis secondary to an obstructing 2-3 mm stone at the right UVJ. 2. Additional nonobstructing 2 mm stones in the right kidney. 3. Hepatic steatosis. 4. Splenomegaly. 5. Fat containing bilateral inguinal hernias.  Aortic Atherosclerosis (ICD10-I70.0).   Electronically Signed By: Katherine Mantle M.D. On: 07/08/2019 17:30   I have personally reviewed the images and agree with radiologist interpretation.    Assessment & Plan:    1. Right Ureteral stone in right UVJ/hydronephrosis, right UVJ 2 mm ureteral calculus  In the absence of ongoing pain and a negative urinalysis today, highly suspicious that he is passed a stone in the interim.  I offered him a renal ultrasound for confirmation to assess for resolution of hydronephrosis which he declined.  Patient advised to call if he has any symptoms and will consider repeat imaging if his pain recurs  2. Right kidney stone CT renal stone study on 07/08/2019 showed a nonobstructing 2 mm stones in the right kidney. Will continue surveillance.  We discussed general stone prevention techniques including drinking plenty water with goal  of producing 2.5 L urine daily, increased citric acid intake, avoidance of high oxalate containing foods, and decreased salt intake.  Information about dietary recommendations given today.   3. Gross hematuria  Associated with stone episode, resolved  UA today is unremarkable   F/u prn  [facility] [address] [place], [place] [address] [phone]  I, Theador Hawthorne, am acting as a scribe for Dr. Vanna Scotland.  I have  reviewed the above documentation for accuracy and completeness, and I agree with the above.   Vanna Scotland, MD  I spent 45 total minutes on the day of the encounter including pre-visit review of the medical record, face-to-face time with the patient, and post visit ordering of labs/imaging/tests.

## 2019-07-23 ENCOUNTER — Other Ambulatory Visit: Payer: Self-pay

## 2019-07-23 ENCOUNTER — Ambulatory Visit: Payer: BC Managed Care – PPO | Admitting: Urology

## 2019-07-23 VITALS — BP 167/89 | HR 103 | Ht 75.5 in | Wt 297.0 lb

## 2019-07-23 DIAGNOSIS — N200 Calculus of kidney: Secondary | ICD-10-CM | POA: Diagnosis not present

## 2019-07-24 LAB — URINALYSIS, COMPLETE
Bilirubin, UA: NEGATIVE
Glucose, UA: NEGATIVE
Leukocytes,UA: NEGATIVE
Nitrite, UA: NEGATIVE
RBC, UA: NEGATIVE
Specific Gravity, UA: 1.025 (ref 1.005–1.030)
Urobilinogen, Ur: 1 mg/dL (ref 0.2–1.0)
pH, UA: 6 (ref 5.0–7.5)

## 2019-07-24 LAB — MICROSCOPIC EXAMINATION: Bacteria, UA: NONE SEEN

## 2020-08-07 ENCOUNTER — Emergency Department: Payer: BC Managed Care – PPO

## 2020-08-07 ENCOUNTER — Encounter: Payer: Self-pay | Admitting: Emergency Medicine

## 2020-08-07 ENCOUNTER — Other Ambulatory Visit: Payer: Self-pay

## 2020-08-07 ENCOUNTER — Emergency Department
Admission: EM | Admit: 2020-08-07 | Discharge: 2020-08-07 | Disposition: A | Discharge status: Home / Self Care | Payer: BC Managed Care – PPO | Attending: Emergency Medicine | Admitting: Emergency Medicine

## 2020-08-07 DIAGNOSIS — Z7982 Long term (current) use of aspirin: Secondary | ICD-10-CM | POA: Insufficient documentation

## 2020-08-07 DIAGNOSIS — R110 Nausea: Secondary | ICD-10-CM | POA: Diagnosis not present

## 2020-08-07 DIAGNOSIS — R55 Syncope and collapse: Secondary | ICD-10-CM | POA: Insufficient documentation

## 2020-08-07 DIAGNOSIS — Z87891 Personal history of nicotine dependence: Secondary | ICD-10-CM | POA: Insufficient documentation

## 2020-08-07 DIAGNOSIS — R519 Headache, unspecified: Secondary | ICD-10-CM | POA: Diagnosis present

## 2020-08-07 DIAGNOSIS — W19XXXA Unspecified fall, initial encounter: Secondary | ICD-10-CM | POA: Diagnosis not present

## 2020-08-07 LAB — CBC
HCT: 48.6 % (ref 39.0–52.0)
Hemoglobin: 16 g/dL (ref 13.0–17.0)
MCH: 29.1 pg (ref 26.0–34.0)
MCHC: 32.9 g/dL (ref 30.0–36.0)
MCV: 88.4 fL (ref 80.0–100.0)
Platelets: 230 10*3/uL (ref 150–400)
RBC: 5.5 MIL/uL (ref 4.22–5.81)
RDW: 14.6 % (ref 11.5–15.5)
WBC: 9.6 10*3/uL (ref 4.0–10.5)
nRBC: 0 % (ref 0.0–0.2)

## 2020-08-07 LAB — BASIC METABOLIC PANEL
Anion gap: 7 (ref 5–15)
BUN: 15 mg/dL (ref 6–20)
CO2: 24 mmol/L (ref 22–32)
Calcium: 9.1 mg/dL (ref 8.9–10.3)
Chloride: 108 mmol/L (ref 98–111)
Creatinine, Ser: 0.83 mg/dL (ref 0.61–1.24)
GFR, Estimated: >60 mL/min (ref >60)
Glucose, Bld: 112 mg/dL — ABNORMAL HIGH (ref 70–99)
Potassium: 3.5 mmol/L (ref 3.5–5.1)
Sodium: 139 mmol/L (ref 135–145)

## 2020-08-07 MED ORDER — ACETAMINOPHEN 500 MG PO TABS
1000.0000 mg | ORAL_TABLET | Freq: Once | ORAL | Status: AC
  Administered 2020-08-07: 1000 mg via ORAL
  Filled 2020-08-07: qty 2

## 2020-08-07 MED ORDER — ONDANSETRON 4 MG PO TBDP: ORAL_TABLET | ORAL | 0 refills | Status: AC

## 2020-08-07 MED ORDER — IBUPROFEN 600 MG PO TABS
600.0000 mg | ORAL_TABLET | Freq: Once | ORAL | Status: AC
  Administered 2020-08-07: 600 mg via ORAL
  Filled 2020-08-07: qty 1

## 2020-08-07 MED ORDER — ONDANSETRON 4 MG PO TBDP: ORAL_TABLET | ORAL | 0 refills | Status: DC

## 2020-08-07 NOTE — ED Provider Notes (Signed)
[facility] Emergency Department Provider Note  ____________________________________________   Event Date/Time   First MD Initiated Contact with Patient 08/07/20 [phone]     (approximate)  I have reviewed the triage vital signs and the nursing notes.   HISTORY  Chief Complaint Loss of Consciousness    HPI Albert Houston is a 45 y.o. male who presents for evaluation of a couple of issues.  He said that he is having trouble with his wife and he thinks that the upset and anxiety of that issue is made it hard for him to eat.  Every time he tries to eat he gets nauseated.  He has a history of migraines, and starting within the last 24 hours he gradually developed a headache that has been worse.  This evening he tried to speak with his wife but states "she started and on me again" anxiety got worse.  At some point he passed out and hit his head, his right knee, and his right elbow on the ground.  He is feeling better now but has a persistent headache.  He is able to walk and move his elbow without difficulty.  Nothing in particular makes the symptoms better and emotional upset seems to make it worse.  He denies chest pain, shortness of breath, vomiting, and abdominal pain.  But nausea is also a problem for him but he has had some issues with nausea in the past.  Overall he describes his symptoms as severe and in general they have been gradual in onset.     Past Medical History:  Diagnosis Date   Flu 03-17-15   RESOLVED NOW (03-26-15)   Headache    h/o migraine    Patient Active Problem List   Diagnosis Date Noted   Genu varum 04/14/2019   Olecranon bursitis of left elbow 05/24/2016   Puncture wound of ankle with complication 06/13/2014   Ingrown toenail 06/13/2014   Left ankle injury 06/07/2014   Encounter to establish care 03/01/2014   Tobacco use disorder 03/01/2014   Abscess of skin of abdomen 03/01/2014   Low back pain 03/01/2014    Past Surgical  History:  Procedure Laterality Date   HERNIA REPAIR     INGUINAL HERNIA REPAIR Left 04/01/2015   Procedure: LAPAROSCOPIC INGUINAL HERNIA;  Surgeon: Kieth BrightlySeeplaputhur G Sankar, MD;  Location: [facility] ORS;  Service: General;  Laterality: Left;   KNEE ARTHROSCOPY Left 04/14/2019   Procedure: LEFT KNEE ARTHROSCOPY, CHONDROPLASTY, AND HIGH TIBIAL OSTEOTOMY;  Surgeon: Signa KellPatel, Sunny, MD;  Location: [facility] ORS;  Service: Orthopedics;  Laterality: Left;   MANDIBLE FRACTURE SURGERY  2005   TYMPANOSTOMY TUBE PLACEMENT     As a Child   UMBILICAL HERNIA REPAIR N/A 04/01/2015   Procedure: laproscopic umbilical hernia repair;  Surgeon: Kieth BrightlySeeplaputhur G Sankar, MD;  Location: [facility] OR[facility];  Service: General;  Laterality: N/A;    Prior to Admission medications   Medication Sig Start Date End Date Taking? Authorizing Provider  aspirin EC 325 MG EC tablet Take 1 tablet (325 mg total) by mouth daily. 04/15/19   Anson OregonMcGhee, Bracken Lance, PA-C  naproxen (NAPROSYN) 500 MG tablet Take 500 mg by mouth 2 (two) times daily. 07/15/19   [provider]  ondansetron (ZOFRAN ODT) 4 MG disintegrating tablet Allow 1-2 tablets to dissolve in your mouth every 8 hours as needed for nausea/vomiting 08/07/20   Loleta RoseForbach, Laprecious Austill, MD    Allergies Patient has no known allergies.  Family History  Problem Relation Age of Onset  Arthritis Maternal Grandmother    Hypertension Maternal Grandmother    Diabetes Paternal Grandfather     Social History Social History   Tobacco Use   Smoking status: Former    Packs/day: 1.50    Years: 12.00    Pack years: 18.00    Types: Cigarettes    Quit date: 01/09/2017    Years since quitting: 3.5   Smokeless tobacco: Never   Tobacco comments:    using patch currently but if he forgets patch he will smoke   Vaping Use   Vaping Use: Every day   Substances: Flavoring  Substance Use Topics   Alcohol use: Yes    Alcohol/week: 1.0 standard drink    Types: 1 Cans of beer per week    Comment: rare   Drug use:  No    Review of Systems Constitutional: No fever/chills Eyes: No visual changes. ENT: No sore throat. Cardiovascular: Denies chest pain. Respiratory: Denies shortness of breath. Gastrointestinal: Nausea, no vomiting.  No abdominal pain. Genitourinary: Negative for dysuria. Musculoskeletal: Pain in right elbow and right knee after fall.   Integumentary: Negative for rash. Neurological: Headache both before and after fall.  No focal numbness nor weakness.   ____________________________________________   PHYSICAL EXAM:  VITAL SIGNS: ED Triage Vitals  Enc Vitals Group     BP 08/07/20 0115 132/90     Pulse Rate 08/07/20 0115 82     Resp 08/07/20 0115 20     Temp 08/07/20 0115 98.4 F (36.9 C)     Temp Source 08/07/20 0115 Oral     SpO2 08/07/20 0115 97 %     Weight 08/07/20 0038 127 kg (280 lb)     Height 08/07/20 0038 1.905 m (6\' 3" )     Head Circumference --      Peak Flow --      Pain Score 08/07/20 0038 10     Pain Loc --      Pain Edu? --      Excl. in GC? --     Constitutional: Alert and oriented.  Eyes: Conjunctivae are normal.  Head: Atraumatic.  I do not see any evidence of contusion, hematoma, nor abrasion. Nose: No congestion/rhinnorhea. Mouth/Throat: Chronically poor dentition. Neck: No stridor.  No meningeal signs.   Cardiovascular: Normal rate, regular rhythm. Good peripheral circulation. Respiratory: Normal respiratory effort.  No retractions. Gastrointestinal: Soft and nontender. No distention.  Musculoskeletal: No lower extremity tenderness nor edema. No gross deformities of extremities.  I do not see any substantial abrasions or ecchymoses nor effusions of his right elbow nor his right knee.  He is ambulatory and weightbearing without difficulty. Neurologic:  Normal speech and language. No gross focal neurologic deficits are appreciated.  Skin:  Skin is warm, dry and intact. Psychiatric: Mood and affect are normal. Speech and behavior are  normal.  ____________________________________________   LABS (all labs ordered are listed, but only abnormal results are displayed)  Labs Reviewed  BASIC METABOLIC PANEL - Abnormal; Notable for the following components:      Result Value   Glucose, Bld 112 (*)    All other components within normal limits  CBC  URINALYSIS, COMPLETE (UACMP) WITH MICROSCOPIC  CBG MONITORING, ED   ____________________________________________  EKG  ED ECG REPORT I, 08/09/20, the attending physician, personally viewed and interpreted this ECG.  Date: 08/07/2020 EKG Time: 00:37 Rate: 93 Rhythm: normal sinus rhythm with occasional PVC QRS Axis: normal Intervals: normal ST/T Wave abnormalities: Non-specific ST segment /  T-wave changes, but no clear evidence of acute ischemia. Narrative Interpretation: no definitive evidence of acute ischemia; does not meet STEMI criteria.  ____________________________________________  RADIOLOGY I, Loleta Rose, personally viewed and evaluated these images (plain radiographs) as part of my medical decision making, as well as reviewing the written report by the radiologist.  ED MD interpretation: No acute abnormality on head CT nor on x-rays of the right elbow and right knee.  Official radiology report(s): DG Elbow Complete Right  Result Date: 08/07/2020 CLINICAL DATA:  Fall EXAM: RIGHT ELBOW - COMPLETE 3+ VIEW COMPARISON:  None. FINDINGS: There is no evidence of fracture, dislocation, or joint effusion. There is no evidence of arthropathy or other focal bone abnormality. Soft tissues are unremarkable. IMPRESSION: Negative. Electronically Signed   By: Tish Frederickson M.D.   On: 08/07/2020 01:14   CT HEAD WO CONTRAST  Result Date: 08/07/2020 CLINICAL DATA:  Polytrauma, critical, head/C-spine injury suspected. Pt reports he has a history of migraines and developed a headache this afternoon. Pt reports this evening he passed out, hit his head, EXAM: CT HEAD  WITHOUT CONTRAST TECHNIQUE: Contiguous axial images were obtained from the base of the skull through the vertex without intravenous contrast. COMPARISON:  CT head 04/08/2007 FINDINGS: Brain: No evidence of large-territorial acute infarction. No parenchymal hemorrhage. No mass lesion. No extra-axial collection. No mass effect or midline shift. Nonspecific slightly asymmetric lateral ventricles. No hydrocephalus. Basilar cisterns are patent. Vascular: No hyperdense vessel. Skull: No acute fracture or focal lesion. Sinuses/Orbits: Persistent partial opacification the mastoid air cells. No fluid within the middle ears. Otherwise paranasal sinuses are clear. The orbits are unremarkable. Other: None. IMPRESSION: No acute intracranial abnormality. Electronically Signed   By: Tish Frederickson M.D.   On: 08/07/2020 01:14   DG Knee Complete 4 Views Right  Result Date: 08/07/2020 CLINICAL DATA:  Status post fall EXAM: RIGHT KNEE - COMPLETE 4+ VIEW COMPARISON:  None. FINDINGS: No evidence of fracture, dislocation, or joint effusion. No evidence of arthropathy or other focal bone abnormality. Soft tissues are unremarkable. IMPRESSION: Negative. Electronically Signed   By: Tish Frederickson M.D.   On: 08/07/2020 01:16    ____________________________________________   PROCEDURES   Procedure(s) performed (including Critical Care):  Procedures   ____________________________________________   INITIAL IMPRESSION / MDM / ASSESSMENT AND PLAN / ED COURSE  As part of my medical decision making, I reviewed the following data within the electronic MEDICAL RECORD NUMBER Nursing notes reviewed and incorporated, Labs reviewed , EKG interpreted , Old chart reviewed, Radiograph reviewed , and Notes from prior ED visits   Differential diagnosis includes, but is not limited to, stress/anxiety, migraine, malnutrition or dehydration, electrolyte or metabolic abnormality, acute intracranial hemorrhage, fracture or  dislocation.  Patient is generally well-appearing and not distressed.  He has been able to get some sleep since coming to the ED.  Vital signs are stable and within normal limits.  No evidence of acute or emergent abnormality on EKG.  CT head does not demonstrate any acute abnormality.  I personally reviewed the patient's imaging and agree with the radiologist's interpretation that there are no fractures or dislocations on the x-rays of the right elbow nor the right knee.  Basic metabolic panel, CBC are both within normal limits.  Patient has no urinary symptoms.  We talked about the need for him to try and eat something even if it is bland so that he can stay hydrated and get some nutrition.  This will likely help with his  headache.  We talked about treatment for his headache particularly given that he has a history of migraine and I offered oral medication such as Fioricet versus IV treatment for migraine.  He prefers to take something by mouth and go home to get some sleep.  I explained that Fioricet has caffeine and so we are both reluctant to try that.  Instead I will give him Tylenol 1000 mg and ibuprofen 600 mg.  I also sent a prescription to his pharmacy for more Zofran.  I gave my usual and customary outpatient follow-up recommendations and return precautions.           ____________________________________________  FINAL CLINICAL IMPRESSION(S) / ED DIAGNOSES  Final diagnoses:  Nonintractable episodic headache, unspecified headache type  Fall, initial encounter     MEDICATIONS GIVEN DURING THIS VISIT:  Medications  acetaminophen (TYLENOL) tablet 1,000 mg (1,000 mg Oral Given 08/07/20 0722)  ibuprofen (ADVIL) tablet 600 mg (600 mg Oral Given 08/07/20 1975)     ED Discharge Orders          Ordered    ondansetron (ZOFRAN ODT) 4 MG disintegrating tablet  Status:  Discontinued        08/07/20 0733    ondansetron (ZOFRAN ODT) 4 MG disintegrating tablet        08/07/20 0734              Note:  This document was prepared using Dragon voice recognition software and may include unintentional dictation errors.   Loleta Rose, MD 08/07/20 [phone]

## 2020-08-07 NOTE — ED Triage Notes (Signed)
Pt presents to ER from home reports he has not been able to eat since last Sunday reports every time he tries to eat he gets nauseated. Pt reports he has a history of migraines and developed a headache this afternoon. Pt reports this evening he passed out, hit his head, right knee and right elbow. Pt talks in complete sentences no respiratory distress noted

## 2020-08-07 NOTE — Discharge Instructions (Addendum)
Your workup in the Emergency Department today was reassuring.  We did not find any specific abnormalities.  We recommend you drink plenty of fluids, take your regular medications and/or any new ones prescribed today, and follow up with the doctor(s) listed in these documents as recommended.  Return to the Emergency Department if you develop new or worsening symptoms that concern you.  

## 2021-01-03 IMAGING — XA DG C-ARM 1-60 MIN
15 of 16 series · 15 of 16 positions shown · non-contrast
Comparison: Radiographs dated 03/12/2019

CLINICAL DATA: Tibial osteotomy.

EXAM:
LEFT KNEE - 1-2 VIEW; DG C-ARM 1-60 MIN

[Series 1: cont. · 1 of 1 slices shown (1 of 15)]
[im 1/1]
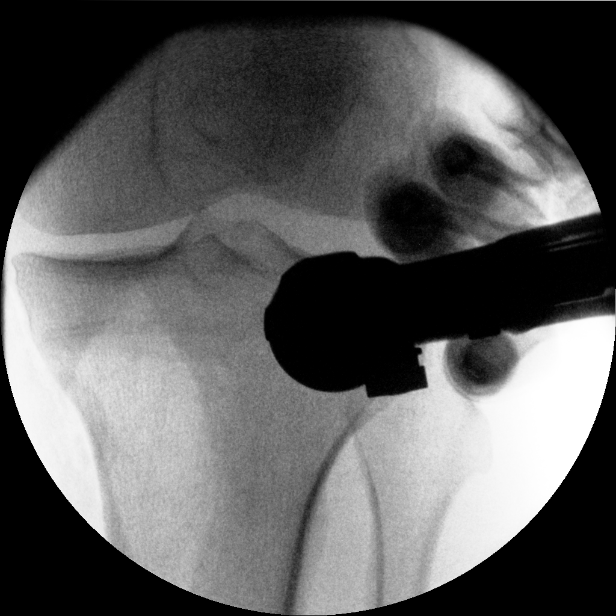

[Series 2: cont. · 1 of 1 slices shown (2 of 15)]
[im 1/1]
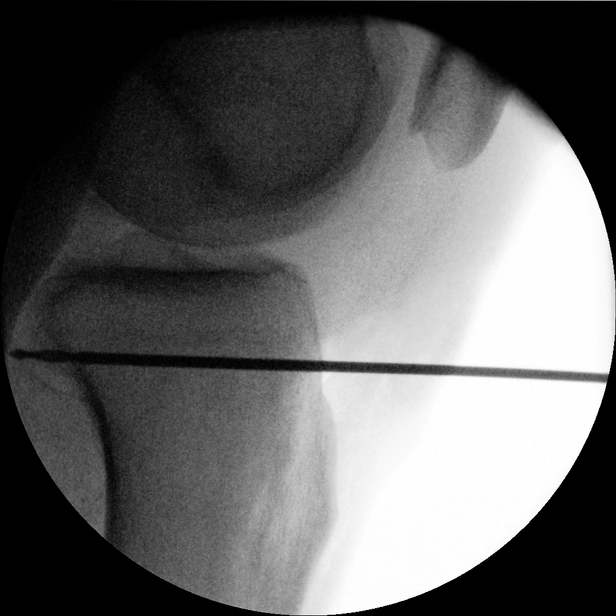

[Series 3: cont. · 1 of 1 slices shown (3 of 15)]
[im 1/1]
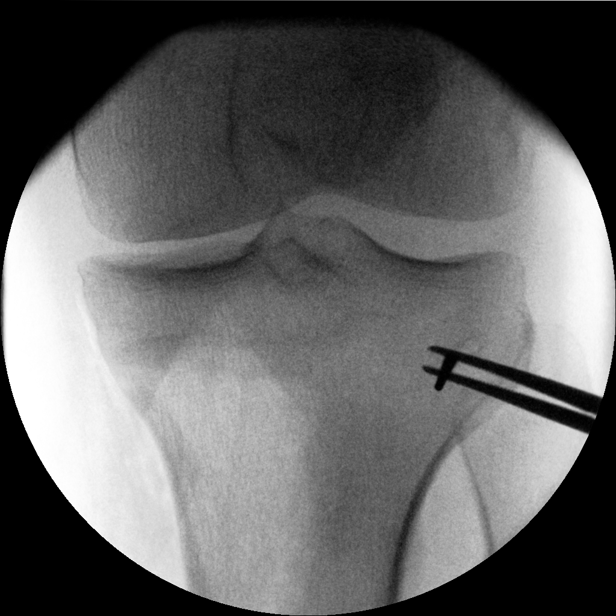

[Series 4: cont. · 1 of 1 slices shown (4 of 15)]
[im 1/1]
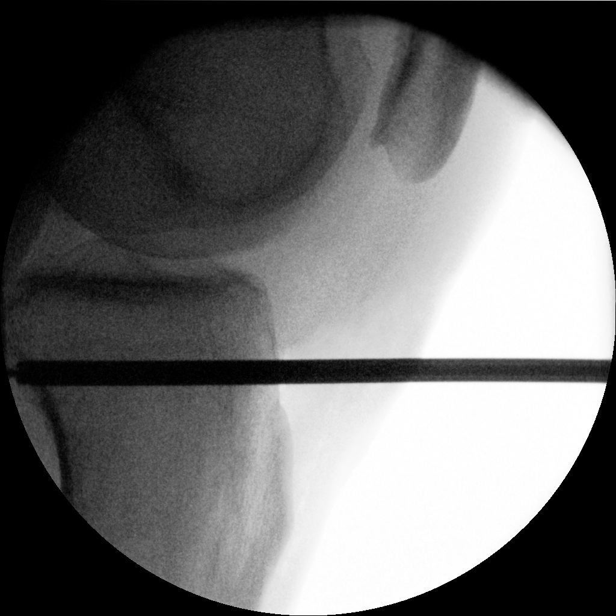

[Series 5: cont. · 1 of 1 slices shown (5 of 15)]
[im 1/1]
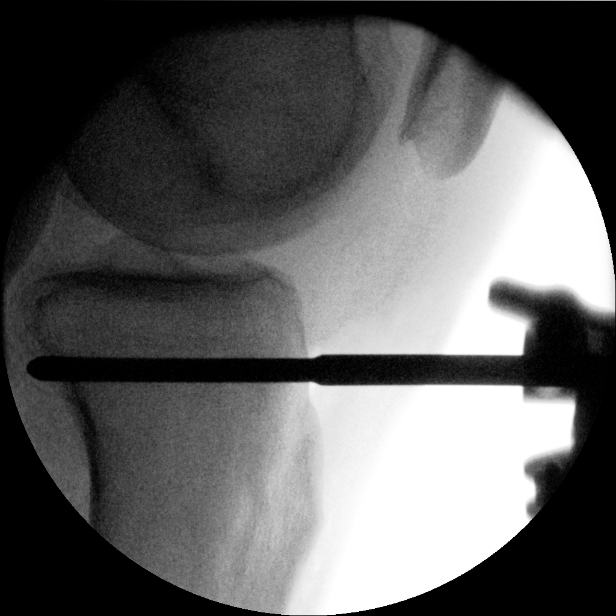

[Series 6: cont. · 1 of 1 slices shown (6 of 15)]
[im 1/1]
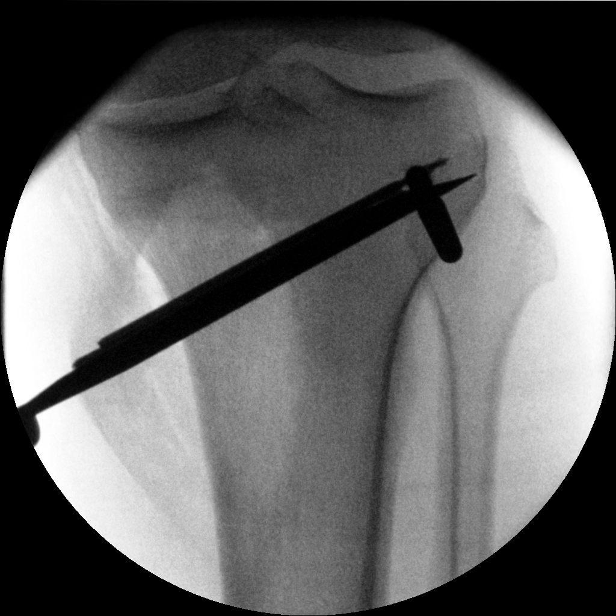

[Series 7: cont. · 1 of 1 slices shown (7 of 15)]
[im 1/1]
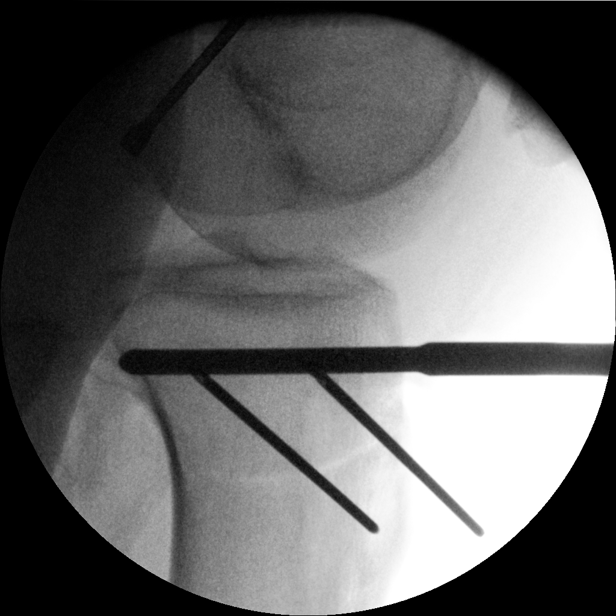

[Series 9: cont. · 1 of 1 slices shown (8 of 15)]
[im 1/1]
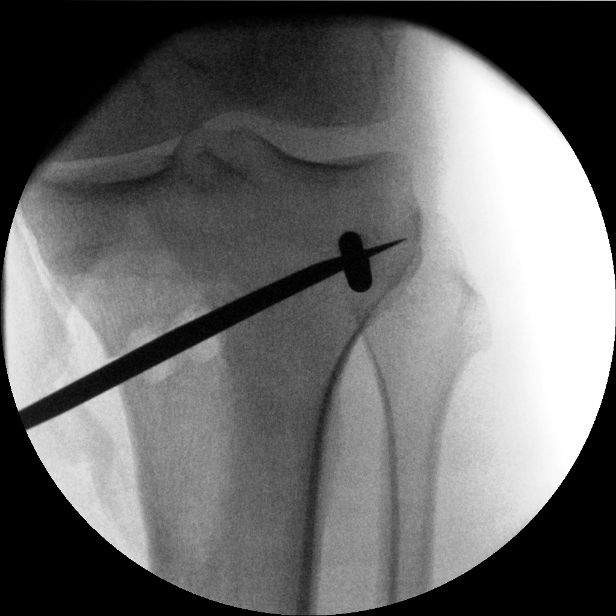

[Series 10: cont. · 1 of 1 slices shown (9 of 15)]
[im 1/1]
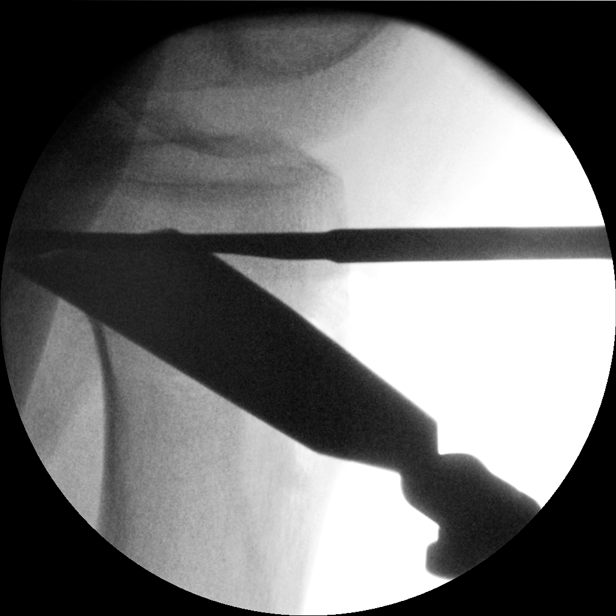

[Series 11: cont. · 1 of 1 slices shown (10 of 15)]
[im 1/1]
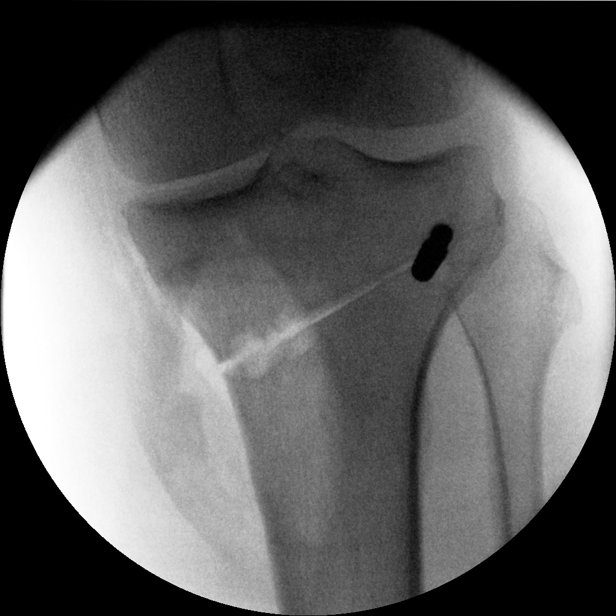

[Series 12: cont. · 1 of 1 slices shown (11 of 15)]
[im 1/1]
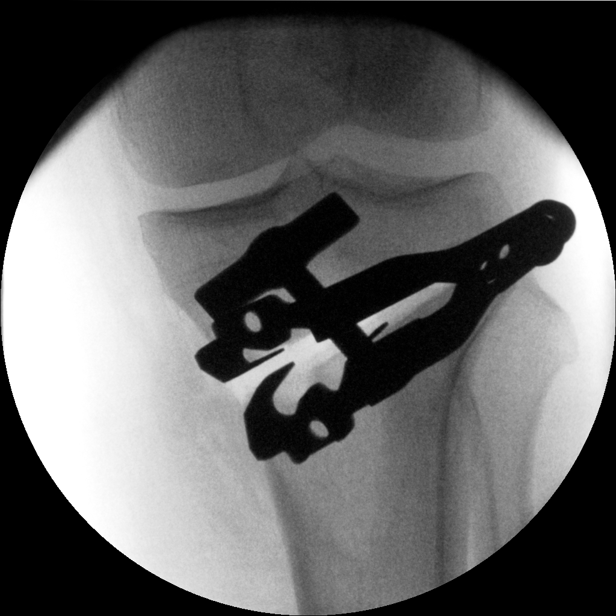

[Series 13: cont. · 1 of 1 slices shown (12 of 15)]
[im 1/1]
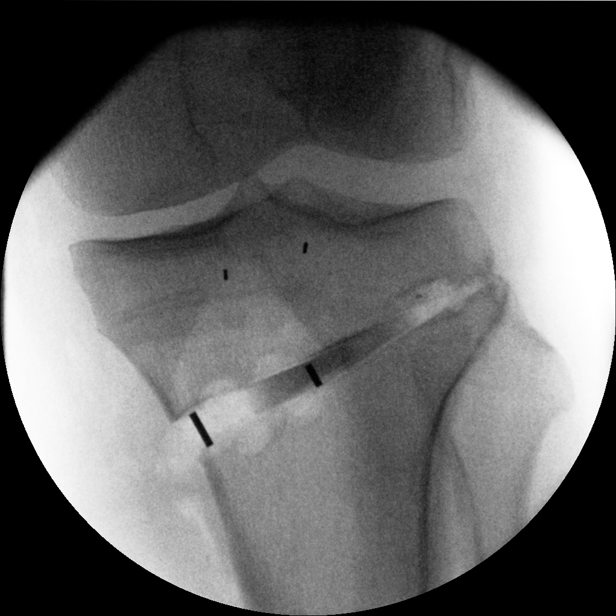

[Series 14: cont. · 1 of 1 slices shown (13 of 15)]
[im 1/1]
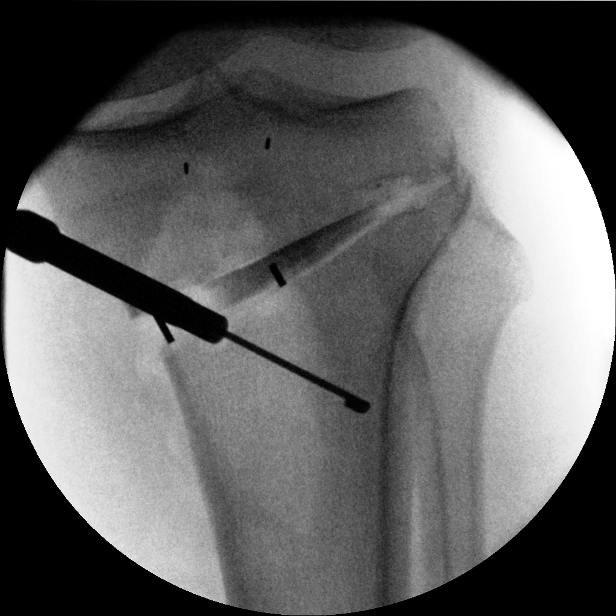

[Series 15: cont. · 1 of 1 slices shown (14 of 15)]
[im 1/1]
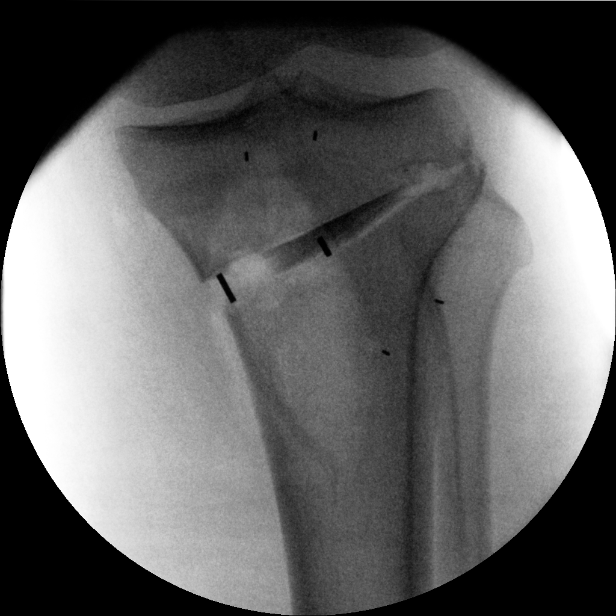

[Series 16: cont. · 1 of 1 slices shown (15 of 15)]
[im 1/1]
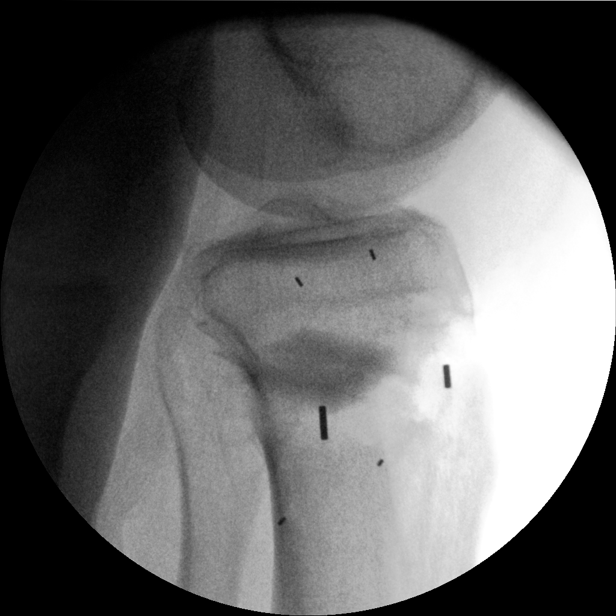

[15 of 16 positions shown; findings below may reference images not displayed]

FINDINGS: Multiple C-arm images demonstrate the patient undergoing proximal
left tibial osteotomy.
IMPRESSION: Proximal left tibial osteotomy.

FLUOROSCOPY TIME:  48 seconds

C-arm fluoroscopic images were obtained intraoperatively and
submitted for post operative interpretation.

## 2021-01-03 IMAGING — XA DG KNEE 1-2V*L*
15 of 16 series · 15 of 16 positions shown · non-contrast
Comparison: Radiographs dated 03/12/2019

CLINICAL DATA: Tibial osteotomy.

EXAM:
LEFT KNEE - 1-2 VIEW; DG C-ARM 1-60 MIN

[Series 1: cont. · 1 of 1 slices shown (1 of 15)]
[im 1/1]
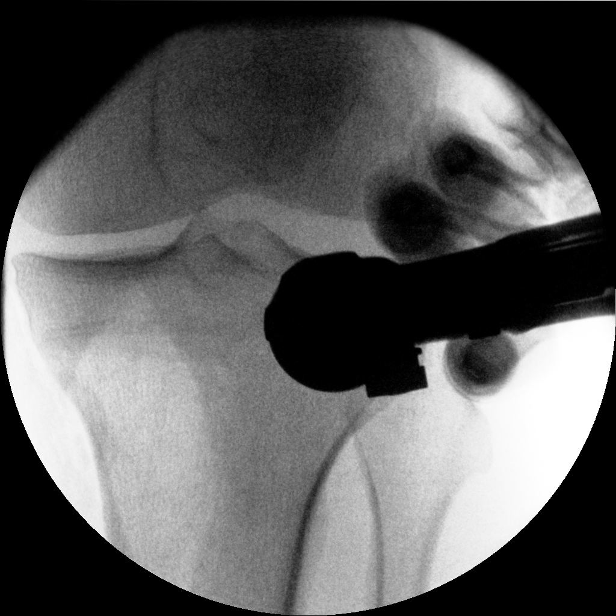

[Series 2: cont. · 1 of 1 slices shown (2 of 15)]
[im 1/1]
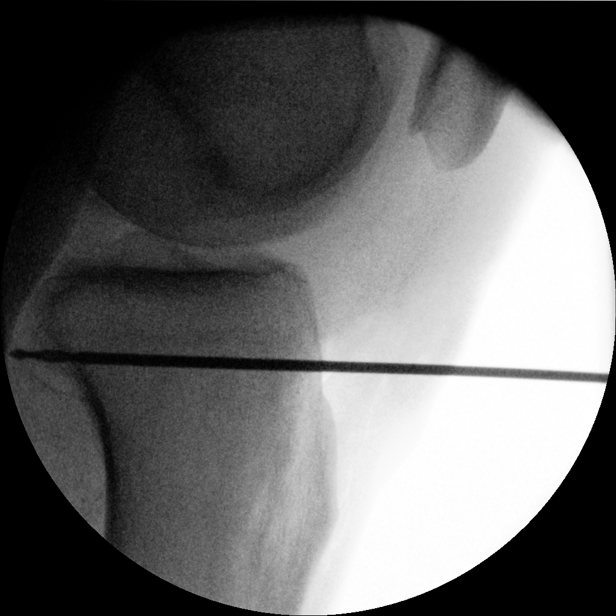

[Series 3: cont. · 1 of 1 slices shown (3 of 15)]
[im 1/1]
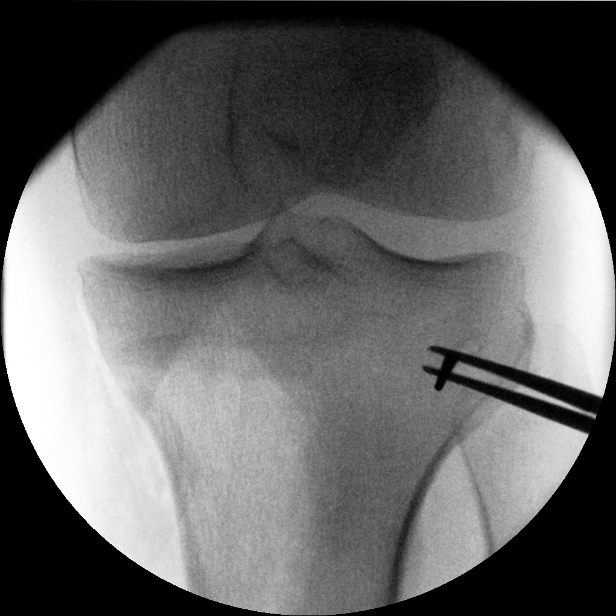

[Series 4: cont. · 1 of 1 slices shown (4 of 15)]
[im 1/1]
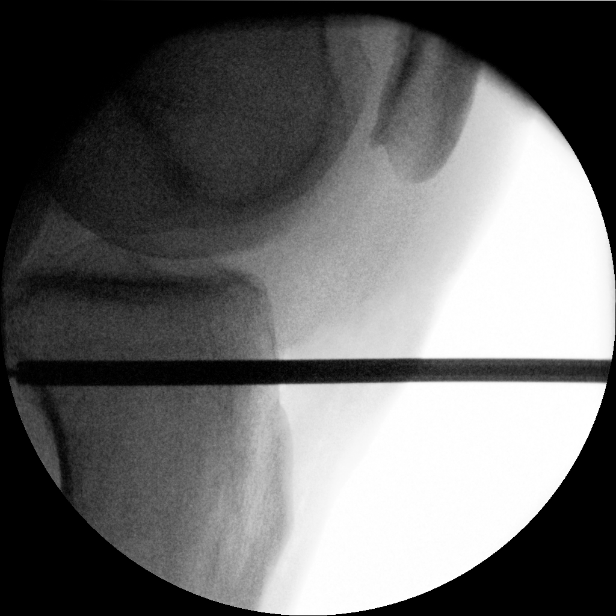

[Series 5: cont. · 1 of 1 slices shown (5 of 15)]
[im 1/1]
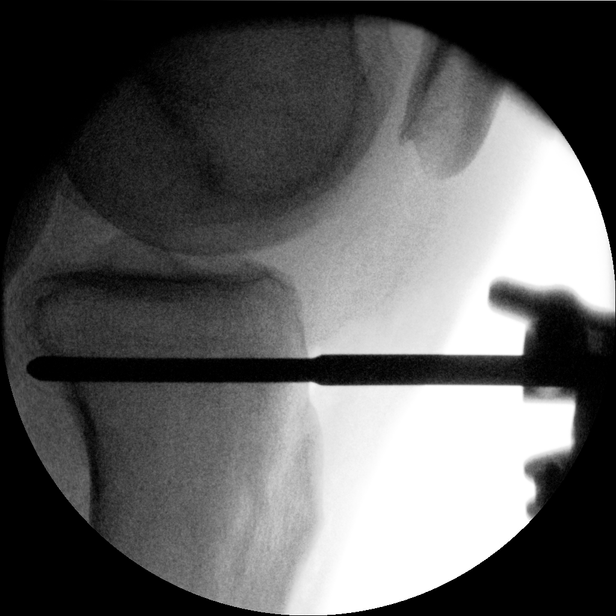

[Series 6: cont. · 1 of 1 slices shown (6 of 15)]
[im 1/1]
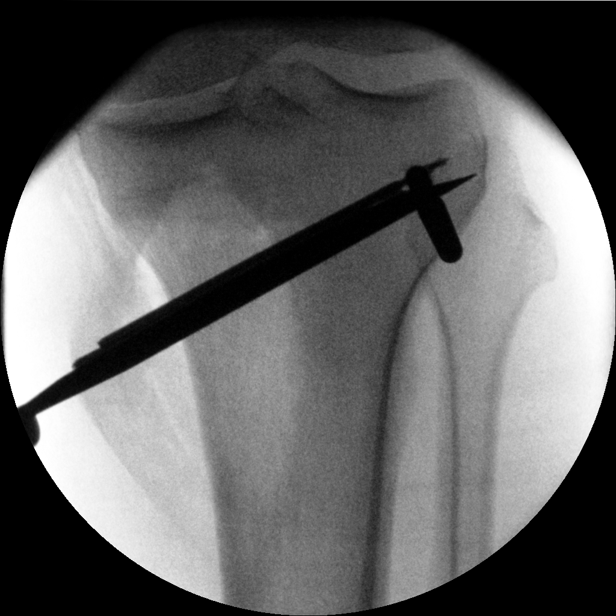

[Series 7: cont. · 1 of 1 slices shown (7 of 15)]
[im 1/1]
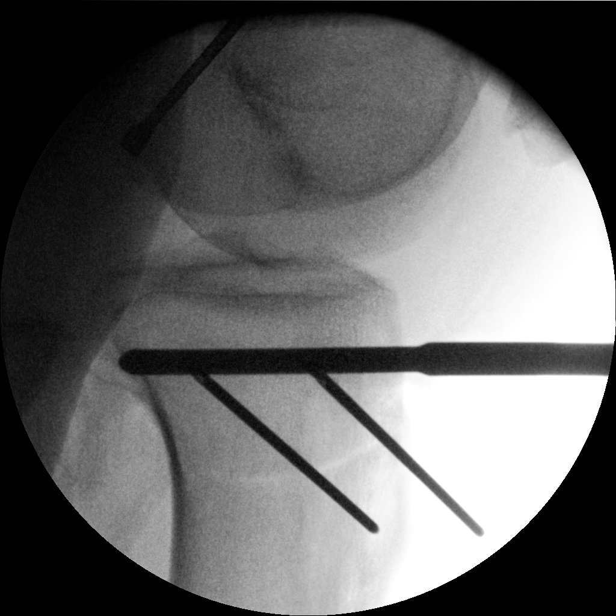

[Series 9: cont. · 1 of 1 slices shown (8 of 15)]
[im 1/1]
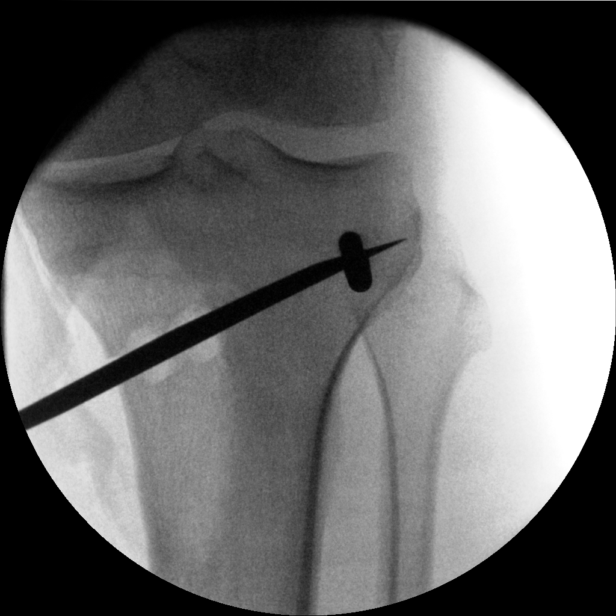

[Series 10: cont. · 1 of 1 slices shown (9 of 15)]
[im 1/1]
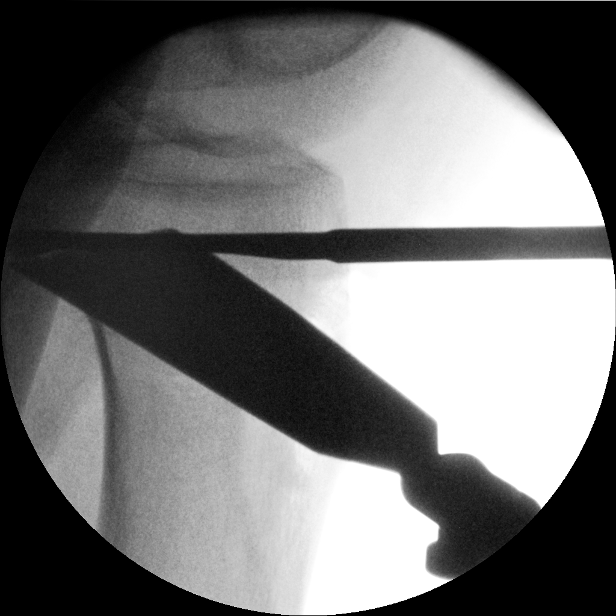

[Series 11: cont. · 1 of 1 slices shown (10 of 15)]
[im 1/1]
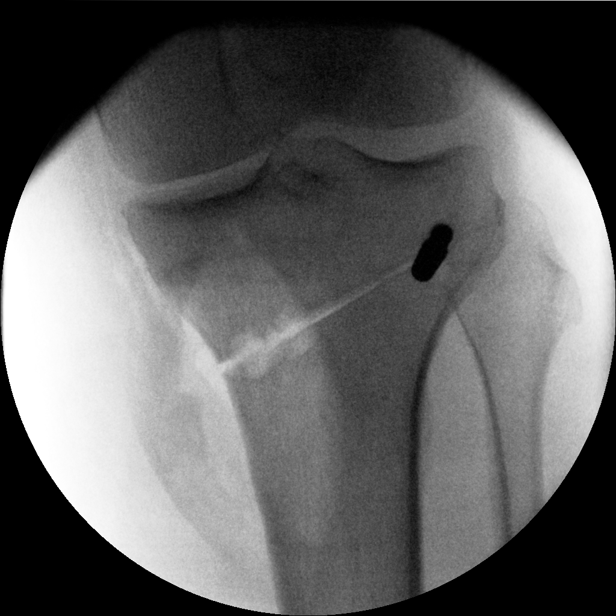

[Series 12: cont. · 1 of 1 slices shown (11 of 15)]
[im 1/1]
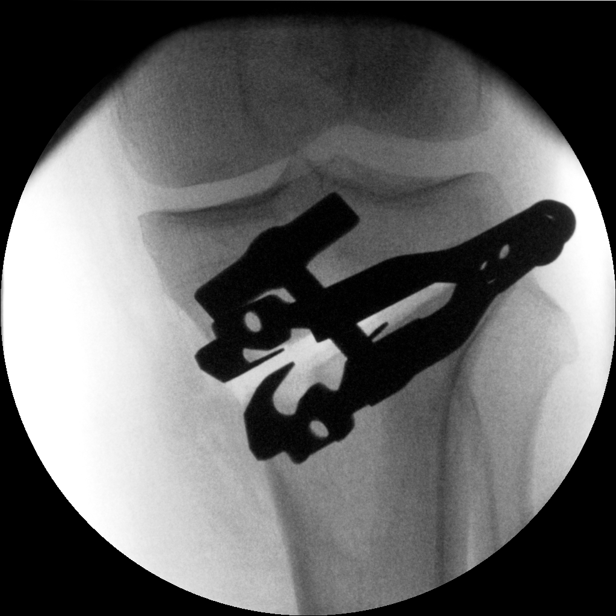

[Series 13: cont. · 1 of 1 slices shown (12 of 15)]
[im 1/1]
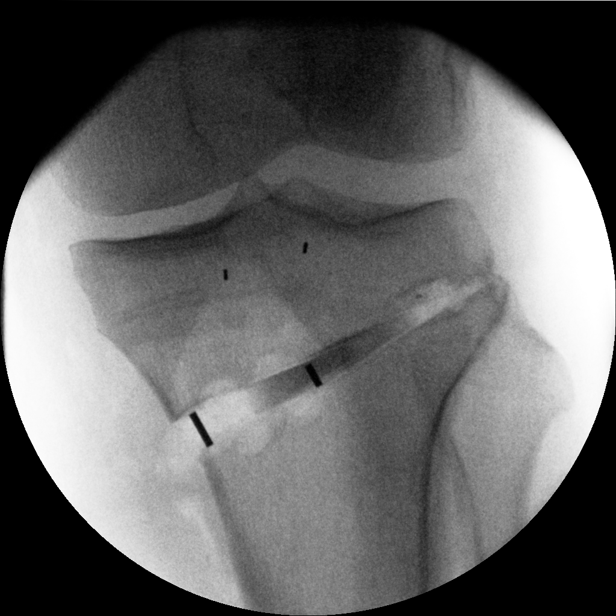

[Series 14: cont. · 1 of 1 slices shown (13 of 15)]
[im 1/1]
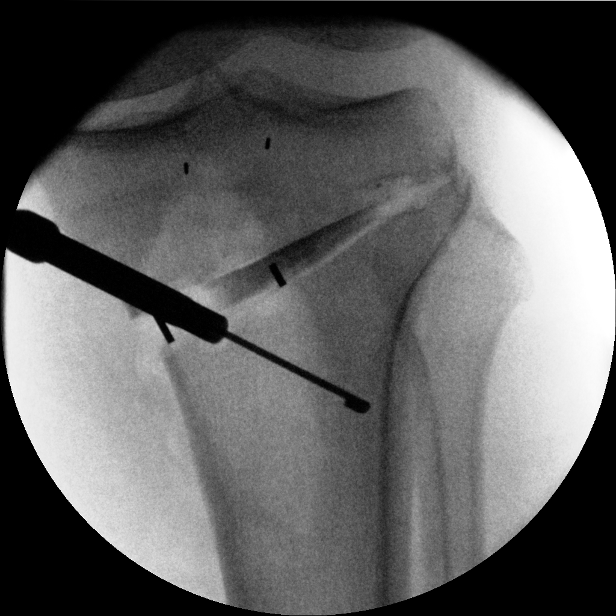

[Series 15: cont. · 1 of 1 slices shown (14 of 15)]
[im 1/1]
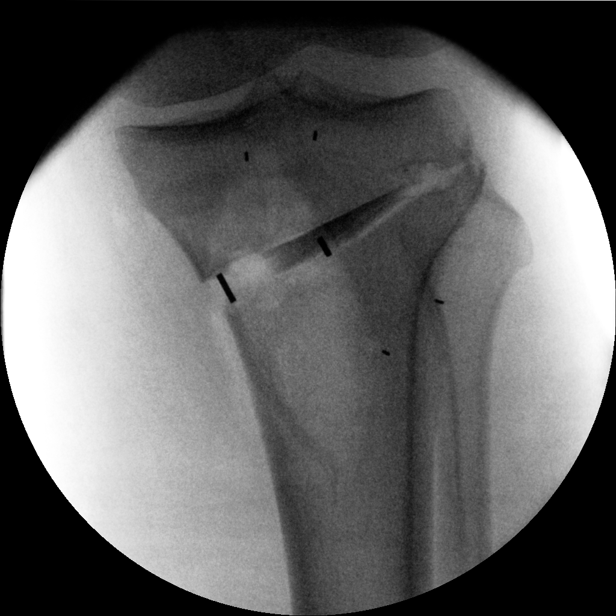

[Series 16: cont. · 1 of 1 slices shown (15 of 15)]
[im 1/1]
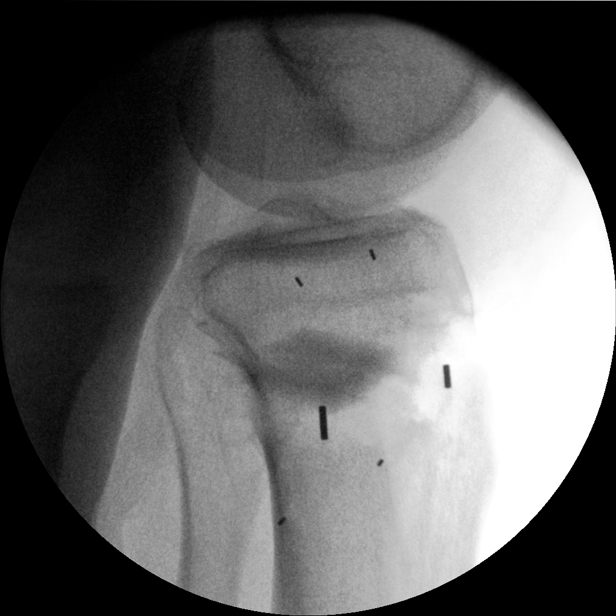

[15 of 16 positions shown; findings below may reference images not displayed]

FINDINGS: Multiple C-arm images demonstrate the patient undergoing proximal
left tibial osteotomy.
IMPRESSION: Proximal left tibial osteotomy.

FLUOROSCOPY TIME:  48 seconds

C-arm fluoroscopic images were obtained intraoperatively and
submitted for post operative interpretation.
